# Patient Record
Sex: Male | Born: 1935 | Race: White | Hispanic: No | Smoking: Former smoker
Health system: Southern US, Community
[De-identification: ages and names within clinical notes are randomized; demographics above are authoritative.]

## PROBLEM LIST (undated history)

## (undated) DIAGNOSIS — N3289 Other specified disorders of bladder: Secondary | ICD-10-CM

## (undated) DIAGNOSIS — B351 Tinea unguium: Secondary | ICD-10-CM

## (undated) DIAGNOSIS — M109 Gout, unspecified: Secondary | ICD-10-CM

## (undated) DIAGNOSIS — N400 Enlarged prostate without lower urinary tract symptoms: Secondary | ICD-10-CM

## (undated) DIAGNOSIS — I471 Supraventricular tachycardia, unspecified: Secondary | ICD-10-CM

## (undated) DIAGNOSIS — K5730 Diverticulosis of large intestine without perforation or abscess without bleeding: Secondary | ICD-10-CM

## (undated) DIAGNOSIS — Z8546 Personal history of malignant neoplasm of prostate: Secondary | ICD-10-CM

## (undated) DIAGNOSIS — Z87442 Personal history of urinary calculi: Secondary | ICD-10-CM

## (undated) DIAGNOSIS — C459 Mesothelioma, unspecified: Secondary | ICD-10-CM

## (undated) DIAGNOSIS — G4733 Obstructive sleep apnea (adult) (pediatric): Secondary | ICD-10-CM

## (undated) DIAGNOSIS — M1990 Unspecified osteoarthritis, unspecified site: Secondary | ICD-10-CM

## (undated) DIAGNOSIS — G629 Polyneuropathy, unspecified: Secondary | ICD-10-CM

## (undated) DIAGNOSIS — G3184 Mild cognitive impairment, so stated: Secondary | ICD-10-CM

## (undated) HISTORY — DX: Unspecified osteoarthritis, unspecified site: M19.90

## (undated) HISTORY — DX: Personal history of urinary calculi: Z87.442

## (undated) HISTORY — DX: Obstructive sleep apnea (adult) (pediatric): G47.33

## (undated) HISTORY — PX: OTHER SURGICAL HISTORY: SHX169

## (undated) HISTORY — DX: Diverticulosis of large intestine without perforation or abscess without bleeding: K57.30

## (undated) HISTORY — DX: Mesothelioma, unspecified: C45.9

## (undated) HISTORY — DX: Mild cognitive impairment of uncertain or unknown etiology: G31.84

## (undated) HISTORY — DX: Tinea unguium: B35.1

## (undated) HISTORY — DX: Gout, unspecified: M10.9

## (undated) HISTORY — PX: CATARACT EXTRACTION: SUR2

## (undated) HISTORY — DX: Supraventricular tachycardia, unspecified: I47.10

## (undated) HISTORY — DX: Other specified disorders of bladder: N32.89

## (undated) HISTORY — DX: Supraventricular tachycardia: I47.1

## (undated) HISTORY — DX: Polyneuropathy, unspecified: G62.9

## (undated) HISTORY — PX: TOTAL KNEE ARTHROPLASTY: SHX125

## (undated) HISTORY — DX: Benign prostatic hyperplasia without lower urinary tract symptoms: N40.0

## (undated) HISTORY — DX: Personal history of malignant neoplasm of prostate: Z85.46

## (undated) HISTORY — PX: TONSILLECTOMY: SUR1361

---

## 1988-05-02 HISTORY — PX: OTHER SURGICAL HISTORY: SHX169

## 1988-05-02 HISTORY — PX: TRANSURETHRAL RESECTION OF PROSTATE: SHX73

## 2007-05-03 DIAGNOSIS — G3184 Mild cognitive impairment, so stated: Secondary | ICD-10-CM | POA: Insufficient documentation

## 2009-01-21 ENCOUNTER — Ambulatory Visit: Payer: Self-pay | Admitting: Internal Medicine

## 2009-01-21 DIAGNOSIS — R32 Unspecified urinary incontinence: Secondary | ICD-10-CM

## 2009-01-21 DIAGNOSIS — G589 Mononeuropathy, unspecified: Secondary | ICD-10-CM | POA: Insufficient documentation

## 2009-01-21 DIAGNOSIS — Z87442 Personal history of urinary calculi: Secondary | ICD-10-CM | POA: Insufficient documentation

## 2009-01-21 DIAGNOSIS — N400 Enlarged prostate without lower urinary tract symptoms: Secondary | ICD-10-CM | POA: Insufficient documentation

## 2009-01-21 DIAGNOSIS — M1990 Unspecified osteoarthritis, unspecified site: Secondary | ICD-10-CM | POA: Insufficient documentation

## 2009-01-21 DIAGNOSIS — E538 Deficiency of other specified B group vitamins: Secondary | ICD-10-CM | POA: Insufficient documentation

## 2009-01-21 DIAGNOSIS — K5730 Diverticulosis of large intestine without perforation or abscess without bleeding: Secondary | ICD-10-CM | POA: Insufficient documentation

## 2009-01-21 DIAGNOSIS — C61 Malignant neoplasm of prostate: Secondary | ICD-10-CM

## 2009-01-21 DIAGNOSIS — I498 Other specified cardiac arrhythmias: Secondary | ICD-10-CM | POA: Insufficient documentation

## 2009-01-21 DIAGNOSIS — E119 Type 2 diabetes mellitus without complications: Secondary | ICD-10-CM

## 2009-01-21 DIAGNOSIS — G4733 Obstructive sleep apnea (adult) (pediatric): Secondary | ICD-10-CM | POA: Insufficient documentation

## 2009-01-23 ENCOUNTER — Encounter: Payer: Self-pay | Admitting: Internal Medicine

## 2009-01-23 LAB — CONVERTED CEMR LAB
Basophils Absolute: 0.1 10*3/uL (ref 0.0–0.1)
Eosinophils Absolute: 0.2 10*3/uL (ref 0.0–0.7)
GFR calc non Af Amer: 85.45 mL/min (ref >60)
HCT: 38.3 % — ABNORMAL LOW (ref 39.0–52.0)
Hgb A1c MFr Bld: 5 % (ref 4.6–6.5)
Lymphs Abs: 1.2 10*3/uL (ref 0.7–4.0)
MCV: 93.6 fL (ref 78.0–100.0)
Monocytes Absolute: 0.5 10*3/uL (ref 0.1–1.0)
Monocytes Relative: 6.8 % (ref 3.0–12.0)
RDW: 14.3 % (ref 11.5–14.6)

## 2009-01-26 ENCOUNTER — Telehealth: Payer: Self-pay | Admitting: Internal Medicine

## 2009-01-26 ENCOUNTER — Encounter: Payer: Self-pay | Admitting: Internal Medicine

## 2009-02-06 ENCOUNTER — Ambulatory Visit: Payer: Self-pay | Admitting: Internal Medicine

## 2009-02-06 DIAGNOSIS — E039 Hypothyroidism, unspecified: Secondary | ICD-10-CM | POA: Insufficient documentation

## 2009-02-09 LAB — CONVERTED CEMR LAB: TSH: 0.59 microintl units/mL (ref 0.35–5.50)

## 2009-03-30 ENCOUNTER — Encounter: Payer: Self-pay | Admitting: Internal Medicine

## 2009-03-30 ENCOUNTER — Telehealth: Payer: Self-pay | Admitting: Internal Medicine

## 2009-05-14 ENCOUNTER — Telehealth: Payer: Self-pay | Admitting: Internal Medicine

## 2009-05-22 ENCOUNTER — Ambulatory Visit: Payer: Self-pay | Admitting: Internal Medicine

## 2009-05-22 DIAGNOSIS — L570 Actinic keratosis: Secondary | ICD-10-CM | POA: Insufficient documentation

## 2009-05-25 ENCOUNTER — Telehealth: Payer: Self-pay | Admitting: Internal Medicine

## 2009-08-24 ENCOUNTER — Ambulatory Visit: Payer: Self-pay | Admitting: Internal Medicine

## 2009-08-26 LAB — CONVERTED CEMR LAB
Albumin: 4 g/dL (ref 3.5–5.2)
Basophils Absolute: 0 10*3/uL (ref 0.0–0.1)
Calcium: 9.5 mg/dL (ref 8.4–10.5)
Creatinine, Ser: 0.9 mg/dL (ref 0.4–1.5)
Eosinophils Absolute: 0.2 10*3/uL (ref 0.0–0.7)
Glucose, Bld: 87 mg/dL (ref 70–99)
HCT: 36.6 % — ABNORMAL LOW (ref 39.0–52.0)
Lymphocytes Relative: 19.2 % (ref 12.0–46.0)
Lymphs Abs: 1.4 10*3/uL (ref 0.7–4.0)
MCHC: 34.8 g/dL (ref 30.0–36.0)
Monocytes Absolute: 0.5 10*3/uL (ref 0.1–1.0)
Neutro Abs: 5.2 10*3/uL (ref 1.4–7.7)
Neutrophils Relative %: 70.7 % (ref 43.0–77.0)
Phosphorus: 4.1 mg/dL (ref 2.3–4.6)
Platelets: 127 10*3/uL — ABNORMAL LOW (ref 150.0–400.0)
RBC: 3.82 M/uL — ABNORMAL LOW (ref 4.22–5.81)
RDW: 14.2 % (ref 11.5–14.6)
Sodium: 143 meq/L (ref 135–145)

## 2009-10-26 ENCOUNTER — Ambulatory Visit: Payer: Self-pay | Admitting: Internal Medicine

## 2009-10-26 ENCOUNTER — Telehealth: Payer: Self-pay | Admitting: Internal Medicine

## 2009-10-27 ENCOUNTER — Encounter: Payer: Self-pay | Admitting: Internal Medicine

## 2009-10-30 ENCOUNTER — Encounter: Payer: Self-pay | Admitting: Internal Medicine

## 2010-02-15 ENCOUNTER — Telehealth: Payer: Self-pay | Admitting: Internal Medicine

## 2010-02-15 ENCOUNTER — Ambulatory Visit: Payer: Self-pay | Admitting: Internal Medicine

## 2010-02-15 LAB — HM DIABETES FOOT EXAM

## 2010-02-17 LAB — CONVERTED CEMR LAB
AST: 23 units/L (ref 0–37)
Albumin: 4 g/dL (ref 3.5–5.2)
Basophils Relative: 0.7 % (ref 0.0–3.0)
CO2: 31 meq/L (ref 19–32)
Calcium: 9.7 mg/dL (ref 8.4–10.5)
Eosinophils Absolute: 0.2 10*3/uL (ref 0.0–0.7)
Free T4: 0.88 ng/dL (ref 0.60–1.60)
Glucose, Bld: 98 mg/dL (ref 70–99)
HCT: 39.2 % (ref 39.0–52.0)
Hemoglobin: 13.5 g/dL (ref 13.0–17.0)
Lymphs Abs: 1.4 10*3/uL (ref 0.7–4.0)
Phosphorus: 4.1 mg/dL (ref 2.3–4.6)
Sodium: 143 meq/L (ref 135–145)
TSH: 0.26 microintl units/mL — ABNORMAL LOW (ref 0.35–5.50)
Total Bilirubin: 1 mg/dL (ref 0.3–1.2)
Total Protein: 6.8 g/dL (ref 6.0–8.3)

## 2010-02-23 ENCOUNTER — Telehealth: Payer: Self-pay | Admitting: Internal Medicine

## 2010-02-25 ENCOUNTER — Encounter: Payer: Self-pay | Admitting: Internal Medicine

## 2010-03-01 ENCOUNTER — Encounter: Payer: Self-pay | Admitting: Internal Medicine

## 2010-03-04 ENCOUNTER — Telehealth: Payer: Self-pay | Admitting: Internal Medicine

## 2010-03-17 ENCOUNTER — Telehealth: Payer: Self-pay | Admitting: Internal Medicine

## 2010-03-18 ENCOUNTER — Encounter: Payer: Self-pay | Admitting: Internal Medicine

## 2010-05-18 ENCOUNTER — Ambulatory Visit: Admit: 2010-05-18 | Payer: Self-pay | Admitting: Internal Medicine

## 2010-06-01 NOTE — Progress Notes (Signed)
Summary: prior auth needed for aricept  Phone Note From Pharmacy   Caller: [facility] Summary of Call: Prior Berkley Harvey is needed for aricept, form is on your desk. Initial call taken by: Lowella Petties CMA, AAMA,  March 17, 2010 10:22 AM  Follow-up for Phone Call        form done Follow-up by: Cindee Salt MD,  March 17, 2010 2:13 PM  Additional Follow-up for Phone Call Additional follow up Details #1::        Form faxed.              Lowella Petties CMA, AAMA  March 17, 2010 3:02 PM  Approval letter placed on doctor's desk for signature and scanning. Additional Follow-up by: Lowella Petties CMA, AAMA,  March 19, 2010 12:09 PM

## 2010-06-01 NOTE — Progress Notes (Signed)
Summary: diverticulitis  Phone Note Call from Patient Call back at Home Phone [phone]   Caller: Patient Call For: Cindee Salt MD Summary of Call: Patient called saying that he had a flare up of diverticulitis patient also states its not serious but, he is still very tender when pressing on the area. Patient says that he dosent want to caome in just wonders if you can call medication in for the diverticulitis. Initial call taken by: Benny Lennert CMA Duncan Dull),  May 25, 2009 11:19 AM  Follow-up for Phone Call        okay to send Rx for cipro 500mg  two times a day  #14 x 0 see if fever, sig pain or not able to eat Follow-up by: Cindee Salt MD,  May 25, 2009 11:56 AM  Additional Follow-up for Phone Call Additional follow up Details #1::        Rx Called In, Spoke with patient and advised results.  Additional Follow-up by: Mervin Hack CMA Duncan Dull),  May 25, 2009 12:12 PM    New/Updated Medications: CIPROFLOXACIN HCL 500 MG TABS (CIPROFLOXACIN HCL) take 1 by mouth two times a day Prescriptions: CIPROFLOXACIN HCL 500 MG TABS (CIPROFLOXACIN HCL) take 1 by mouth two times a day  #14 x 0   Entered by:   Mervin Hack CMA (AAMA)   Authorized by:   Cindee Salt MD   Signed by:   Mervin Hack CMA (AAMA) on 05/25/2009   Method used:   Electronically to        [facility]. [address] [phone]* (retail)       [address] [place], [place]  [id number]       Ph: [phone]       Fax: [phone]   RxID:   [id number]

## 2010-06-01 NOTE — Progress Notes (Signed)
Summary: Order for CPAP Machine  Order for CPAP Machine   Imported By: Lanelle Bal 02/20/2010 11:14:59  _____________________________________________________________________  External Attachment:    Type:   Image     Comment:   External Document

## 2010-06-01 NOTE — Medication Information (Signed)
Summary: Aricept Approved  Aricept Approved   Imported By: Maryln Gottron 03/26/2010 15:28:49  _____________________________________________________________________  External Attachment:    Type:   Image     Comment:   External Document

## 2010-06-01 NOTE — Assessment & Plan Note (Signed)
Summary: 6 m f/u dlo   Vital Signs:  Patient profile:   77 year old male Weight:      220 pounds Temp:     98.3 degrees F oral Pulse rate:   72 / minute Pulse rhythm:   regular BP sitting:   120 / 70  (left arm) Cuff size:   large  Vitals Entered By: Mervin Hack CMA Duncan Dull) (February 15, 2010 10:25 AM) CC: 6 month follow-up   History of Present Illness: Doing fine Left arm is almost completely healed SOme trouble with full internal rotation but no sig pain  Still gets intermittent back pain has restarted seeing chiropractor---this does help him out some Gets momentary sense of weakness in right leg---not falls though  Occ cognitive problems Trouble with word finding at times One episode of the "disconnect" but very brief Still competitive with bridge  Bladder has been okay still with slow stream but empites okay Nocturia x 2  hasn't been checking sugars A1c has been fine  No cehst pain  No palpitiatons  Allergies: 1)  ! Penicillin V Potassium (Penicillin V Potassium) 2)  ! Keflex (Cephalexin)  Past History:  Past medical, surgical, family and social histories (including risk factors) reviewed for relevance to current acute and chronic problems.  Past Medical History: Reviewed history from 01/21/2009 and no changes required. Mild cognitive impairment Neuropathy Prostate cancer, hx of----lupron/casodex x 6 months then RT Benign prostatic hypertrophy Diabetes mellitus, type II--2008  Diet controlled Obstructive slep apnea---CPAP 13 Osteoarthritis SVT Nephrolithiasis, hx of Diverticulosis, colon Urinary incontinence/bladder spasm--med/briefs  Past Surgical History: Reviewed history from 01/23/2009 and no changes required. Tonsillectomy Transurethral resection of prostate--1990 SVT ablation--1990 Left TKR---   ~2007 Eyelid reduction 2002/2006 Cataracts OU then laser Rx to both eyes  Family History: Reviewed history from 01/21/2009 and no changes  required. Mom died of breast cancer @85  Dad died of nephritis @59  1 sister --breast cancer survivor Kendell Bane) 1 brother--Crohn's disease No CAD, HTN Son with DM No colon cancer  Social History: Reviewed history from 01/21/2009 and no changes required. 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 Has living will. Requests DNR very strongly. Would not accept tube feedings  Review of Systems       sleeps well in general Appetite is good---  weight down 9# but he isn't sure why Has been trying to be careful with quantity that he eats  Physical Exam  General:  alert and normal appearance.   Neck:  supple, no masses, no thyromegaly, no carotid bruits, and no cervical lymphadenopathy.   Lungs:  normal respiratory effort, no intercostal retractions, no accessory muscle use, and normal breath sounds.   Heart:  normal rate, regular rhythm, and no gallop.   SOft systolic murmur at apex Pulses:  faint in feet Extremities:  no sig edema Skin:  no suspicious lesions and no ulcerations.   Psych:  normally interactive, good eye contact, not anxious appearing, and not depressed appearing.    Diabetes Management Exam:    Foot Exam (with socks and/or shoes not present):       Sensory-Pinprick/Light touch:          Left medial foot (L-4): diminished          Left dorsal foot (L-5): diminished  Left lateral foot (S-1): diminished          Right medial foot (L-4): diminished          Right dorsal foot (L-5): diminished          Right lateral foot (S-1): diminished       Inspection:          Left foot: normal          Right foot: normal       Nails:          Left foot: fungal infection          Right foot: fungal infection    Eye Exam:       Eye Exam done elsewhere           Date: 11/30/2009          Results: no retinopathy          Done by: Dr Dewaine Conger   Impression & Recommendations:  Problem # 1:  DIABETES MELLITUS, TYPE II (ICD-250.00) Assessment Unchanged  fine with just diet Rx will recheck --not clear he really has this diagnosis any more  His updated medication list for this problem includes:    Aspirin 325 Mg Tabs (Aspirin) .Marland Kitchen... Take 1/2 by mouth once daily  Labs Reviewed: Creat: 0.9 (08/24/2009)     Last Eye Exam: no retinopathy (11/30/2009) Reviewed HgBA1c results: 5.0 (08/24/2009)  5.0 (01/21/2009)  Orders: TLB-A1C / Hgb A1C (Glycohemoglobin) (83036-A1C)  Problem # 2:  SUPRAVENTRICULAR TACHYCARDIA (ICD-427.89) Assessment: Comment Only  no symtpoms  His updated medication list for this problem includes:    Propranolol Hcl Cr 60 Mg Xr24h-cap (Propranolol hcl) .Marland Kitchen... Take 1 by mouth once daily    Aspirin 325 Mg Tabs (Aspirin) .Marland Kitchen... Take 1/2 by mouth once daily  Orders: TLB-Renal Function Panel (80069-RENAL) TLB-CBC Platelet - w/Differential (85025-CBCD) TLB-Hepatic/Liver Function Pnl (80076-HEPATIC) Venipuncture (54098)  Problem # 3:  UNSPECIFIED HYPOTHYROIDISM (ICD-244.9) Assessment: Unchanged  euthyroid  will recheck labs  Labs Reviewed: TSH: 0.33 (08/24/2009)    HgBA1c: 5.0 (08/24/2009)  Orders: TLB-TSH (Thyroid Stimulating Hormone) (84443-TSH) TLB-T4 (Thyrox), Free [phone])  Problem # 4:  URINARY INCONTINENCE (ICD-788.30) Assessment: Unchanged okay with meds  Problem # 5:  VITAMIN B12 DEFICIENCY (ICD-266.2) Assessment: Unchanged okay with the supplements  Problem # 6:  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 Take 1 by mouth once daily 2)  Propranolol Hcl Cr 60 Mg Xr24h-cap (Propranolol hcl) .... Take 1 by mouth once daily 3)  Oxybutynin Chloride 5 Mg Tabs (Oxybutynin  chloride) .... Take 1 by mouth once daily 4)  Cyanocobalamin 1000 Mcg/ml Soln (Cyanocobalamin) .... Inject 1cc intramuscularly every 2 weeks 5)  Aspirin 325 Mg Tabs (Aspirin) .... Take 1/2 by mouth once daily 6)  Daily Vitamins Tabs (Multiple vitamin) .... Take 1 by mouth once daily 7)  Fish Oil 1000 Mg Caps (Omega-3 fatty acids) .... Take 1 by mouth once daily 8)  Vitamin D3 1000 Unit Tabs (Cholecalciferol) .... Take 1 by mouth once daily 9)  Folic Acid 800 Mcg Tabs (Folic acid) .... Take 1 by mouth once daily 10)  B Complex 50 Tabs (B complex vitamins) .... Take 1 by mouth once daily 11)  Metamucil 30.9 % Powd (Psyllium) .... Take 2 teaspoons by mouth once daily  Other Orders: Influenza Vaccine MCR (56213)  Patient Instructions: 1)  Please schedule a  follow-up appointment in 6 months .    Orders Added: 1)  Influenza Vaccine MCR [00025] 2)  Est. Patient Level IV [59563] 3)  TLB-Renal Function Panel [80069-RENAL] 4)  TLB-CBC Platelet - w/Differential [85025-CBCD] 5)  TLB-Hepatic/Liver Function Pnl [80076-HEPATIC] 6)  Venipuncture [87564] 7)  TLB-TSH (Thyroid Stimulating Hormone) [84443-TSH] 8)  TLB-T4 (Thyrox), Free [33295-JO8C] 9)  TLB-A1C / Hgb A1C (Glycohemoglobin) [83036-A1C]   Immunizations Administered:  Influenza Vaccine # 1:    Vaccine Type: Fluvax MCR    Site: left deltoid    Mfr: GlaxoSmithKline    Dose: 0.5 ml    Route: IM    Given by: Mervin Hack CMA (AAMA)    Exp. Date: 10/30/2010    Lot #: [id number]    VIS given: 11/24/09 version given February 15, 2010.  Flu Vaccine Consent Questions:    Do you have a history of severe allergic reactions to this vaccine? no    Any prior history of allergic reactions to egg and/or gelatin? no    Do you have a sensitivity to the preservative Thimersol? no    Do you have a past history of Guillan-Barre Syndrome? no    Do you currently have an acute febrile illness? no    Have you ever had a severe reaction to latex? no     Vaccine information given and explained to patient? yes   Immunizations Administered:  Influenza Vaccine # 1:    Vaccine Type: Fluvax MCR    Site: left deltoid    Mfr: GlaxoSmithKline    Dose: 0.5 ml    Route: IM    Given by: Mervin Hack CMA (AAMA)    Exp. Date: 10/30/2010    Lot #: [id number]    VIS given: 11/24/09 version given February 15, 2010.  Current Allergies (reviewed today): ! PENICILLIN V POTASSIUM (PENICILLIN V POTASSIUM) ! KEFLEX (CEPHALEXIN)

## 2010-06-01 NOTE — Progress Notes (Signed)
Summary: ? OV  Phone Note Call from Patient Call back at Home Phone [phone] Call back at Out from 12 to 1:30, please leave message   Caller: Patient Call For: Cindee Salt MD Summary of Call: Patient says he just returned from vacation where he fell last Monday.  His left arm is sore and he has very limited motion in that arm.  He wonders if perhaps he needs an x-ray and possibly some PT to loosen up the arm.  He was in hopes of coming in today.  Please advise. Initial call taken by: Delilah Shan CMA Duncan Dull),  October 26, 2009 10:28 AM  Follow-up for Phone Call        okay to add at [facility] --it looks like I still have an opening there Follow-up by: Cindee Salt MD,  October 26, 2009 11:12 AM  Additional Follow-up for Phone Call Additional follow up Details #1::        appt made @ 2;00 Additional Follow-up by: Mervin Hack CMA Duncan Dull),  October 26, 2009 11:33 AM

## 2010-06-01 NOTE — Assessment & Plan Note (Signed)
Summary: 2:00 ARM PAIN/DS   Vital Signs:  Patient profile:   77 year old male Weight:      229 pounds BMI:     35.73 Temp:     98.4 degrees F oral Pulse rate:   60 / minute Pulse rhythm:   regular BP sitting:   122 / 60  (left arm) Cuff size:   large  Vitals Entered By: Mervin Hack CMA Duncan Dull) (October 26, 2009 2:11 PM) CC: arm pain x1 week   History of Present Illness: Was on vacation in [place] fishing for [employer] to plug in CPAP and missed bed as he was sitting down Fell onto left shoulder and arm This happened 1 week ago Sig pain since then Hard to move his arm but slow improvement pain is still fairly significant---limited use  Allergies: 1)  ! Penicillin V Potassium (Penicillin V Potassium) 2)  ! Keflex (Cephalexin)  Past History:  Past medical, surgical, family and social histories (including risk factors) reviewed for relevance to current acute and chronic problems.  Past Medical History: Reviewed history from 01/21/2009 and no changes required. Mild cognitive impairment Neuropathy Prostate cancer, hx of----lupron/casodex x 6 months then RT Benign prostatic hypertrophy Diabetes mellitus, type II--2008  Diet controlled Obstructive slep apnea---CPAP 13 Osteoarthritis SVT Nephrolithiasis, hx of Diverticulosis, colon Urinary incontinence/bladder spasm--med/briefs  Past Surgical History: Reviewed history from 01/23/2009 and no changes required. Tonsillectomy Transurethral resection of prostate--1990 SVT ablation--1990 Left TKR---   ~2007 Eyelid reduction 2002/2006 Cataracts OU then laser Rx to both eyes  Family History: Reviewed history from 01/21/2009 and no changes required. Mom died of breast cancer @85  Dad died of nephritis @59  1 sister --breast cancer survivor Kendell Bane) 1 brother--Crohn's disease No CAD, HTN Son with DM No colon cancer  Social History: Reviewed history from 01/21/2009 and no changes required. 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 Has living will. Requests DNR very strongly. Would not accept tube feedings  Review of Systems       no hand weakness no hand swelling  Physical Exam  General:  alert.  NAD Msk:  sig bruising along mid left arm no clear bony tenderness sig limitation of abduction---he can raise arm above head with other arm and I can passively abduct to 90 degrees  Neurologic:  normal strength in hands   Impression & Recommendations:  Problem # 1:  FRACTURE, HUMERUS, PROXIMAL (ICD-812.00) Assessment New  not clear cut but he does point to this spot with pain lack of abduction could indicate concommitant rotator cuff tear  will put in immobilizer and send to ortho  Orders: Slings- All Types (Z6109) Orthopedic Surgeon Referral (Ortho Surgeon)  Complete Medication List: 1)  Aricept 10 Mg Tabs (Donepezil hcl) .... Take 1 by mouth once daily 2)  Propranolol Hcl Cr 60 Mg Xr24h-cap (Propranolol hcl) .... Take 1 by mouth once daily 3)  Oxybutynin Chloride 5 Mg Tabs (Oxybutynin chloride) .... Take 1 by mouth once daily 4)  Cyanocobalamin 1000 Mcg/ml Soln (Cyanocobalamin) .... Inject 1cc intramuscularly every 2 weeks 5)  Aspirin 325 Mg Tabs (Aspirin) .... Take 1/2 by mouth once daily 6)  Daily Vitamins Tabs (Multiple vitamin) .... Take 1 by mouth once daily 7)  Fish Oil 1000 Mg Caps (Omega-3 fatty acids) .... Take 1 by mouth once daily 8)  Calcium 500 Mg Tabs (Calcium carbonate) .... Take 1 by mouth once daily 9)  Vitamin D3 1000 Unit Tabs (Cholecalciferol) .... Take 1 by mouth once daily 10)  Vitamin E 400 Unit Caps (Vitamin e) .... Take 1 by mouth once daily 11)  Folic Acid 800 Mcg Tabs (Folic acid) .... Take 1 by mouth once daily 12)  Selenium 200 Mcg Tabs  (Selenium) .... Take 1 by mouth once daily 13)  Magnesium 250 Mg Tabs (Magnesium) .... Take 1 by mouth once daily 14)  Zinc 50 Mg Tabs (Zinc) .... Take 1 by mouth once daily 15)  B Complex 50 Tabs (B complex vitamins) .... Take 1 by mouth once daily 16)  Metamucil 30.9 % Powd (Psyllium) .... Take 2 teaspoons by mouth once daily  Other Orders: T-Humerus Left 2 Views (73060TC)  Patient Instructions: 1)  Please keep your regular appt 2)  Referral Appointment Information 3)  Day/Date: 4)  Time: 5)  Place/MD: 6)  Address: 7)  Phone/Fax: 8)  Patient given appointment information. Information/Orders faxed/mailed.  Current Allergies (reviewed today): ! PENICILLIN V POTASSIUM (PENICILLIN V POTASSIUM) ! KEFLEX (CEPHALEXIN)

## 2010-06-01 NOTE — Progress Notes (Signed)
Summary: RX Oxubutynin  Phone Note Call from Patient Call back at [phone]   Caller: Patient Call For: Cindee Salt MD Summary of Call: Patient needs a refill on his Oxybutynin 5mg . sent to [facility] for a 90 day supply with 3 refills Initial call taken by: Sydell Axon LPN,  May 14, 2009 11:51 AM  Follow-up for Phone Call        Rx sent electronically  Please let him know Follow-up by: Cindee Salt MD,  May 14, 2009 1:32 PM  Additional Follow-up for Phone Call Additional follow up Details #1::        left message on machine that rx was sent to [facility] Additional Follow-up by: Mervin Hack CMA Duncan Dull),  May 14, 2009 2:26 PM    Prescriptions: OXYBUTYNIN CHLORIDE 5 MG TABS (OXYBUTYNIN CHLORIDE) take 1 by mouth once daily  #90 x 3   Entered and Authorized by:   Cindee Salt MD   Signed by:   Cindee Salt MD on 05/14/2009   Method used:   Electronically to        [facility]* (mail-order)             ,          Ph: [phone]       Fax: [phone]   RxID:   [id number]

## 2010-06-01 NOTE — Progress Notes (Signed)
Summary: cpap machine   Phone Note Other Incoming   Caller: [facility]  Summary of Call: Melissa from [facility] says that the rx they received for the cpap machine did not include the settings. They are asking for a new order w/ the settings to be faxed to [phone]. Initial call taken by: Melody Comas,  February 23, 2010 10:50 AM  Follow-up for Phone Call        please call him to confirm his settings Cindee Salt MD  February 23, 2010 1:55 PM   left message on machine at home for patient to return my call.  DeShannon Smith CMA Duncan Dull)  February 23, 2010 4:33 PM   left message on machine at home for patient to return my call.  DeShannon Smith CMA Duncan Dull)  February 24, 2010 3:16 PM   Additional Follow-up for Phone Call Additional follow up Details #1::        Pt called back with setting, says it is 13.      Lowella Petties CMA, AAMA  February 25, 2010 9:42 AM   New Rx written Cindee Salt MD  February 25, 2010 12:48 PM   rx faxed and scanned into chart. Additional Follow-up by: Mervin Hack CMA Duncan Dull),  February 25, 2010 2:19 PM

## 2010-06-01 NOTE — Assessment & Plan Note (Signed)
Summary: ROA 4 MTHS CYD   Vital Signs:  Patient profile:   77 year old male Weight:      219 pounds Temp:     98.2 degrees F oral Pulse rate:   64 / minute Pulse rhythm:   regular BP sitting:   110 / 60  (left arm) Cuff size:   large  Vitals Entered By: Mervin Hack CMA Duncan Dull) (May 22, 2009 11:13 AM) CC: 46month follow-up   History of Present Illness: has really settled in at [facility] very well Plays bridge in community  Has a patch of crusting on right arm scratches and it bleeds at times  Knot noted on right foot Not sure how long No pain does follow with podiatrist  Needs new CPAP supplies sleeps well in general  No sig cognitive changes Executive function fine (wins at bridge, handles finances) still on aricept No apraxia  Urination has been fine flow is slow but empties okay still wears briefs  Hasn't been checking sugar A1c was normal    Allergies: 1)  ! Penicillin V Potassium (Penicillin V Potassium) 2)  ! Keflex (Cephalexin)  Past History:  Past medical, surgical, family and social histories (including risk factors) reviewed for relevance to current acute and chronic problems.  Past Medical History: Reviewed history from 01/21/2009 and no changes required. Mild cognitive impairment Neuropathy Prostate cancer, hx of----lupron/casodex x 6 months then RT Benign prostatic hypertrophy Diabetes mellitus, type II--2008  Diet controlled Obstructive slep apnea---CPAP 13 Osteoarthritis SVT Nephrolithiasis, hx of Diverticulosis, colon Urinary incontinence/bladder spasm--med/briefs  Past Surgical History: Reviewed history from 01/23/2009 and no changes required. Tonsillectomy Transurethral resection of prostate--1990 SVT ablation--1990 Left TKR---   ~2007 Eyelid reduction 2002/2006 Cataracts OU then laser Rx to both eyes  Family History: Reviewed history from 01/21/2009 and no changes required. Mom died of breast cancer @85  Dad died  of nephritis @59  1 sister --breast cancer survivor Kendell Bane) 1 brother--Crohn's disease No CAD, HTN Son with DM No colon cancer  Social History: Reviewed history from 01/21/2009 and no changes required. 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 Has living will. Requests DNR very strongly. Would not accept tube feedings  Physical Exam  General:  alert and normal appearance.   Neck:  supple, no masses, no thyromegaly, and no cervical lymphadenopathy.   Lungs:  normal respiratory effort and normal breath sounds.   Heart:  normal rate, regular rhythm, no murmur, and no gallop.   Extremities:  no edema Neurologic:  alert & oriented X3.  No focal weakness Skin:  2 actinics on right arm small cyst just next to tendon to second right toe Psych:  normally interactive, good eye contact, not anxious appearing, and not depressed appearing.    Diabetes Management Exam:    Foot Exam (with socks and/or shoes not present):       Sensory-Pinprick/Light touch:          Left medial foot (L-4): diminished          Left dorsal foot (L-5): diminished          Left lateral foot (S-1): diminished          Right medial foot (L-4): diminished          Right dorsal foot (L-5): diminished  Right lateral foot (S-1): diminished       Inspection:          Left foot: abnormal             Comments: redness at toes          Right foot: abnormal             Comments: redness at toes       Nails:          Left foot: fungal infection          Right foot: fungal infection   Impression & Recommendations:  Problem # 1:  MILD COGNITIVE IMPAIRMENT SO STATED (ICD-331.83) Assessment Unchanged stable no problems with memory, executive funciton or praxis will continue aricept  Problem  # 2:  SUPRAVENTRICULAR TACHYCARDIA (ICD-427.89) Assessment: Unchanged fine on the med  His updated medication list for this problem includes:    Propranolol Hcl Cr 60 Mg Xr24h-cap (Propranolol hcl) .Marland Kitchen... Take 1 by mouth once daily    Aspirin 325 Mg Tabs (Aspirin) .Marland Kitchen... Take 1/2 by mouth once daily  Problem # 3:  UNSPECIFIED HYPOTHYROIDISM (ICD-244.9) Assessment: Unchanged labs okay  no change  Labs Reviewed: TSH: 0.59 (02/06/2009)    HgBA1c: 5.0 (01/21/2009)  Problem # 4:  BENIGN PROSTATIC HYPERTROPHY (ICD-600.00) Assessment: Unchanged voids okay chronic mild incontinence  Problem # 5:  ACTINIC KERATOSIS (ICD-702.0) Assessment: New  each treated 40 seconds x 2  Orders: Cryotherapy/Destruction benign or premalignant lesion (1st lesion)  (17000) Cryotherapy/Destruction benign or premalignant lesion (2nd-14th lesions) (17003)  Complete Medication List: 1)  Aricept 10 Mg Tabs (Donepezil hcl) .... Take 1 by mouth once daily 2)  Propranolol Hcl Cr 60 Mg Xr24h-cap (Propranolol hcl) .... Take 1 by mouth once daily 3)  Oxybutynin Chloride 5 Mg Tabs (Oxybutynin chloride) .... Take 1 by mouth once daily 4)  Cyanocobalamin 1000 Mcg/ml Soln (Cyanocobalamin) .... Inject 1cc intramuscularly every 2 weeks 5)  Aspirin 325 Mg Tabs (Aspirin) .... Take 1/2 by mouth once daily 6)  Daily Vitamins Tabs (Multiple vitamin) .... Take 1 by mouth once daily 7)  Fish Oil 1000 Mg Caps (Omega-3 fatty acids) .... Take 1 by mouth once daily 8)  Calcium 500 Mg Tabs (Calcium carbonate) .... Take 1 by mouth once daily 9)  Vitamin D3 1000 Unit Tabs (Cholecalciferol) .... Take 1 by mouth once daily 10)  Vitamin E 400 Unit Caps (Vitamin e) .... Take 1 by mouth once daily 11)  Folic Acid 800 Mcg Tabs (Folic acid) .... Take 1 by mouth once daily 12)  Selenium 200 Mcg Tabs (Selenium) .... Take 1 by mouth once daily 13)  Magnesium 250 Mg Tabs (Magnesium) .... Take 1 by mouth once daily 14)  Zinc 50 Mg Tabs (Zinc)  .... Take 1 by mouth once daily 15)  B Complex 50 Tabs (B complex vitamins) .... Take 1 by mouth once daily 16)  Metamucil 30.9 % Powd (Psyllium) .... Take 2 teaspoons by mouth once daily  Patient Instructions: 1)  Please schedule a follow-up appointment in 4 months .   Current Allergies (reviewed today): ! PENICILLIN V POTASSIUM (PENICILLIN V POTASSIUM) ! KEFLEX (CEPHALEXIN)

## 2010-06-01 NOTE — Progress Notes (Signed)
Summary: needs new cpap machine   Phone Note Call from Patient Call back at Home Phone [phone]   Caller: Patient Call For: Cindee Salt MD Summary of Call: Patient's cpap mahcine is not working and can't be  repaired according to [facility]. Patient is asking for a rx for a new cpap machine to be faxed to [facility] stating that he requires treatment for sleep apnia. It needs to faxed to [phone] attention Melissa.  Initial call taken by: Melody Comas,  February 15, 2010 1:41 PM  Follow-up for Phone Call        Rx written Follow-up by: Cindee Salt MD,  February 15, 2010 2:06 PM  Additional Follow-up for Phone Call Additional follow up Details #1::        rx faxed and scanned Additional Follow-up by: Mervin Hack CMA Duncan Dull),  February 15, 2010 3:39 PM

## 2010-06-01 NOTE — Letter (Signed)
Summary: Records Dated 09-02-05 thru 06-16-08/Amarish Davd DO  Records Dated 09-02-05 thru 06-16-08/Amarish Davd DO   Imported By: Lanelle Bal 09/04/2009 09:45:10  _____________________________________________________________________  External Attachment:    Type:   Image     Comment:   External Document

## 2010-06-01 NOTE — Assessment & Plan Note (Signed)
Summary: 4 m f/u dlo   Vital Signs:  Patient profile:   77 year old male Weight:      220 pounds Temp:     98.7 degrees F oral Pulse rate:   60 / minute Pulse rhythm:   regular BP sitting:   110 / 62  (left arm) Cuff size:   large  Vitals Entered By: Mervin Hack CMA Duncan Dull) (August 24, 2009 4:23 PM) CC: 4 month follow-up   History of Present Illness: Memory has been stable still highly functional Still helps [facility] with their grant process, etc Did have one period of "disconnect" that lasted seconds----disturbed his flow during bridge but able to get it back Doesn't get lost in car Does his own shopping--mostly prepared foods  doesn't check sugars has had normal sugars  No heart troubles No palpitiations No chest pain Fairly sedentary--does walk up to 1 mile at times No change in exercise tolerance  Having some thumb pain--on right Has DIP nodule worse in past 3 weeks---has "clunking" when he flexes Hasn't used any meds since pain not bad  Podiatrist just trimmed nails  slight bleeding from left 1st  Allergies: 1)  ! Penicillin V Potassium (Penicillin V Potassium) 2)  ! Keflex (Cephalexin)  Past History:  Past medical, surgical, family and social histories (including risk factors) reviewed for relevance to current acute and chronic problems.  Past Medical History: Reviewed history from 01/21/2009 and no changes required. Mild cognitive impairment Neuropathy Prostate cancer, hx of----lupron/casodex x 6 months then RT Benign prostatic hypertrophy Diabetes mellitus, type II--2008  Diet controlled Obstructive slep apnea---CPAP 13 Osteoarthritis SVT Nephrolithiasis, hx of Diverticulosis, colon Urinary incontinence/bladder spasm--med/briefs  Past Surgical History: Reviewed history from 01/23/2009 and no changes required. Tonsillectomy Transurethral resection of prostate--1990 SVT ablation--1990 Left TKR---   ~2007 Eyelid reduction  2002/2006 Cataracts OU then laser Rx to both eyes  Family History: Reviewed history from 01/21/2009 and no changes required. Mom died of breast cancer @85  Dad died of nephritis @59  1 sister --breast cancer survivor Kendell Bane) 1 brother--Crohn's disease No CAD, HTN Son with DM No colon cancer  Social History: Reviewed history from 01/21/2009 and no changes required. 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 Has living will. Requests DNR very strongly. Would not accept tube feedings  Physical Exam  General:  alert and normal appearance.   Neck:  supple, no masses, no thyromegaly, no carotid bruits, and no cervical lymphadenopathy.   Lungs:  normal respiratory effort and normal breath sounds.   Heart:  regular rhythm, no murmur, no gallop, and bradycardia.   Msk:  Nodule on right 2nd PIP--slight clunk but no pain Pulses:  1+ in feet Extremities:  trace edema on left foot none on right Skin:  no suspicious lesions and no ulcerations.    Psych:  normally interactive, good eye contact, not anxious appearing, and not depressed appearing.    Diabetes Management Exam:    Foot Exam (with socks and/or shoes not present):       Sensory-Pinprick/Light touch:          Left medial foot (L-4): diminished          Left dorsal foot (L-5): diminished          Left lateral foot (S-1): diminished  Right medial foot (L-4): diminished          Right dorsal foot (L-5): diminished          Right lateral foot (S-1): diminished       Inspection:          Left foot: normal          Right foot: normal       Nails:          Left foot: fungal infection          Right foot: fungal infection    Eye Exam:       Eye Exam done elsewhere          Date: 12/01/2008           Results: no retinopathy          Done by: Dr Daryl Eastern   Impression & Recommendations:  Problem # 1:  MILD COGNITIVE IMPAIRMENT SO STATED (ICD-331.83) Assessment Unchanged stable cognitive status will continue the aricept  Problem # 2:  SUPRAVENTRICULAR TACHYCARDIA (ICD-427.89) Assessment: Unchanged no recurrences on propranolol also uses this for migraine prophylaxis so will continue despite the (asymptomatic) bradycardia  His updated medication list for this problem includes:    Propranolol Hcl Cr 60 Mg Xr24h-cap (Propranolol hcl) .Marland Kitchen... Take 1 by mouth once daily    Aspirin 325 Mg Tabs (Aspirin) .Marland Kitchen... Take 1/2 by mouth once daily  Problem # 3:  OSTEOARTHRITIS (ICD-715.90) Assessment: Unchanged mild problems only  His updated medication list for this problem includes:    Aspirin 325 Mg Tabs (Aspirin) .Marland Kitchen... Take 1/2 by mouth once daily  Problem # 4:  DIABETES MELLITUS, TYPE II (ICD-250.00) Assessment: Unchanged  fine without Rx will check labs  His updated medication list for this problem includes:    Aspirin 325 Mg Tabs (Aspirin) .Marland Kitchen... Take 1/2 by mouth once daily  Labs Reviewed: Creat: 0.9 (01/21/2009)     Last Eye Exam: no retinopathy (12/01/2008) Reviewed HgBA1c results: 5.0 (01/21/2009)  Orders: TLB-Renal Function Panel (80069-RENAL) TLB-CBC Platelet - w/Differential (85025-CBCD) Venipuncture (16109) TLB-A1C / Hgb A1C (Glycohemoglobin) (83036-A1C)  Problem # 5:  BENIGN PROSTATIC HYPERTROPHY (ICD-600.00) Assessment: Unchanged voids okay  Complete Medication List: 1)  Aricept 10 Mg Tabs (Donepezil hcl) .... Take 1 by mouth once daily 2)  Propranolol Hcl Cr 60 Mg Xr24h-cap (Propranolol hcl) .... Take 1 by mouth once daily 3)  Oxybutynin Chloride 5 Mg Tabs (Oxybutynin chloride) .... Take 1 by mouth once daily 4)  Cyanocobalamin 1000 Mcg/ml Soln (Cyanocobalamin) .... Inject 1cc intramuscularly every 2 weeks 5)  Aspirin 325 Mg Tabs (Aspirin) .... Take 1/2  by mouth once daily 6)  Daily Vitamins Tabs (Multiple vitamin) .... Take 1 by mouth once daily 7)  Fish Oil 1000 Mg Caps (Omega-3 fatty acids) .... Take 1 by mouth once daily 8)  Calcium 500 Mg Tabs (Calcium carbonate) .... Take 1 by mouth once daily 9)  Vitamin D3 1000 Unit Tabs (Cholecalciferol) .... Take 1 by mouth once daily 10)  Vitamin E 400 Unit Caps (Vitamin e) .... Take 1 by mouth once daily 11)  Folic Acid 800 Mcg Tabs (Folic acid) .... Take 1 by mouth once daily 12)  Selenium 200 Mcg Tabs (Selenium) .... Take 1 by mouth once daily 13)  Magnesium 250 Mg Tabs (Magnesium) .... Take 1 by mouth once daily 14)  Zinc 50 Mg Tabs (Zinc) .... Take 1 by mouth once daily 15)  B Complex 50 Tabs (B complex vitamins) .Marland KitchenMarland KitchenMarland Kitchen  Take 1 by mouth once daily 16)  Metamucil 30.9 % Powd (Psyllium) .... Take 2 teaspoons by mouth once daily  Other Orders: TLB-T4 (Thyrox), Free [phone]) TLB-TSH (Thyroid Stimulating Hormone) [phone])  Patient Instructions: 1)  Please schedule a follow-up appointment in 6 months .  Prescriptions: PROPRANOLOL HCL CR 60 MG XR24H-CAP (PROPRANOLOL HCL) take 1 by mouth once daily  #90 x 3   Entered by:   Mervin Hack CMA (AAMA)   Authorized by:   Cindee Salt MD   Signed by:   Mervin Hack CMA (AAMA) on 08/24/2009   Method used:   Electronically to        [facility] (mail-order)             ,          Ph: [phone]       Fax: [phone]   RxID:   [id number]   Current Allergies (reviewed today): ! PENICILLIN V POTASSIUM (PENICILLIN V POTASSIUM) ! KEFLEX (CEPHALEXIN)

## 2010-06-01 NOTE — Miscellaneous (Signed)
Summary: Do Not Resuscitate Orders  Do Not Resuscitate Orders   Imported By: Beau Fanny 12/10/2009 14:52:06  _____________________________________________________________________  External Attachment:    Type:   Image     Comment:   External Document

## 2010-06-01 NOTE — Consult Note (Signed)
Summary: [facility]  [facility]   Imported By: Lanelle Bal 11/11/2009 14:16:06  _____________________________________________________________________  External Attachment:    Type:   Image     Comment:   External Document  Appended Document: [facility] continuing sling for right proximal humeral fracture 2 week follow up

## 2010-06-01 NOTE — Medication Information (Signed)
Summary: Order for CPAP Machine & Supplies  Order for CPAP Machine & Supplies   Imported By: Maryln Gottron 03/03/2010 11:12:29  _____________________________________________________________________  External Attachment:    Type:   Image     Comment:   External Document

## 2010-06-01 NOTE — Progress Notes (Signed)
Summary: aricept  Phone Note Refill Request Message from:  Patient on March 04, 2010 5:02 PM  Refills Requested: Medication #1:  ARICEPT 10 MG TABS take 1 by mouth once daily Patient uses [facility].   Initial call taken by: Melody Comas,  March 04, 2010 5:02 PM    Prescriptions: ARICEPT 10 MG TABS (DONEPEZIL HCL) take 1 by mouth once daily  #90 x 3   Entered by:   Lowella Petties CMA, AAMA   Authorized by:   Cindee Salt MD   Signed by:   Lowella Petties CMA, AAMA on 03/05/2010   Method used:   Faxed to ...       [facility] (mail-[facility])             , [place]         Ph: [phone]       Fax: [phone]   RxID:   [id number]

## 2010-06-01 NOTE — Letter (Signed)
Summary: CMN for CPAP/[facility]  CMN for CPAP/[facility]   Imported By: Lanelle Bal 03/05/2010 15:25:39  _____________________________________________________________________  External Attachment:    Type:   Image     Comment:   External Document

## 2010-06-09 ENCOUNTER — Telehealth: Payer: Self-pay | Admitting: Internal Medicine

## 2010-06-17 NOTE — Progress Notes (Signed)
Summary: new script needed for propranolol  Phone Note Refill Request Message from:  Fax from Pharmacy  Refills Requested: Medication #1:  PROPRANOLOL HCL CR 60 MG XR24H-CAP take 1 by mouth once daily Faxed form from [facility] is on your desk.  They are asking for a new script.  Initial call taken by: Lowella Petties CMA, AAMA,  June 09, 2010 3:36 PM  Follow-up for Phone Call        Rx signed for 1 year Follow-up by: Cindee Salt MD,  June 10, 2010 1:04 PM  Additional Follow-up for Phone Call Additional follow up Details #1::        Rx faxed to pharmacy Additional Follow-up by: DeShannon Smith CMA (AAMA),  June 10, 2010 2:00 PM    Prescriptions: PROPRANOLOL HCL CR 60 MG XR24H-CAP (PROPRANOLOL HCL) take 1 by mouth once daily  #90 x 3   Entered by:   Mervin Hack CMA (AAMA)   Authorized by:   Cindee Salt MD   Signed by:   Mervin Hack CMA (AAMA) on 06/10/2010   Method used:   Handwritten   RxID:   [id number]

## 2010-06-30 ENCOUNTER — Encounter: Payer: Self-pay | Admitting: Internal Medicine

## 2010-06-30 DIAGNOSIS — N400 Enlarged prostate without lower urinary tract symptoms: Secondary | ICD-10-CM

## 2010-06-30 DIAGNOSIS — M1990 Unspecified osteoarthritis, unspecified site: Secondary | ICD-10-CM

## 2010-06-30 DIAGNOSIS — I471 Supraventricular tachycardia, unspecified: Secondary | ICD-10-CM

## 2010-06-30 DIAGNOSIS — R32 Unspecified urinary incontinence: Secondary | ICD-10-CM | POA: Insufficient documentation

## 2010-06-30 DIAGNOSIS — K5730 Diverticulosis of large intestine without perforation or abscess without bleeding: Secondary | ICD-10-CM

## 2010-06-30 DIAGNOSIS — N3289 Other specified disorders of bladder: Secondary | ICD-10-CM

## 2010-06-30 DIAGNOSIS — G4733 Obstructive sleep apnea (adult) (pediatric): Secondary | ICD-10-CM

## 2010-06-30 DIAGNOSIS — G629 Polyneuropathy, unspecified: Secondary | ICD-10-CM

## 2010-06-30 DIAGNOSIS — E119 Type 2 diabetes mellitus without complications: Secondary | ICD-10-CM

## 2010-06-30 DIAGNOSIS — G3184 Mild cognitive impairment, so stated: Secondary | ICD-10-CM

## 2010-06-30 DIAGNOSIS — Z87442 Personal history of urinary calculi: Secondary | ICD-10-CM

## 2010-06-30 DIAGNOSIS — Z8546 Personal history of malignant neoplasm of prostate: Secondary | ICD-10-CM

## 2010-07-14 ENCOUNTER — Encounter: Payer: Self-pay | Admitting: Internal Medicine

## 2010-07-14 ENCOUNTER — Telehealth (INDEPENDENT_AMBULATORY_CARE_PROVIDER_SITE_OTHER): Payer: Self-pay | Admitting: *Deleted

## 2010-07-20 NOTE — Progress Notes (Signed)
Summary: form for CPAP supplies  Phone Note From Pharmacy   Caller: [facility] Summary of Call: Form for CPAP supplies is on your desk.  I spoke with pt and he does want these supplies.                Lowella Petties CMA, AAMA  July 14, 2010 12:58 PM   Follow-up for Phone Call        form signed Follow-up by: Cindee Salt MD,  July 14, 2010 1:38 PM  Additional Follow-up for Phone Call Additional follow up Details #1::        Form faxed to [facility] Additional Follow-up by: Beau Fanny,  July 14, 2010 3:13 PM

## 2010-07-29 NOTE — Medication Information (Signed)
Summary: Order for CPAP Supplies  Order for CPAP Supplies   Imported By: Maryln Gottron 07/19/2010 15:49:37  _____________________________________________________________________  External Attachment:    Type:   Image     Comment:   External Document

## 2010-08-20 ENCOUNTER — Ambulatory Visit (INDEPENDENT_AMBULATORY_CARE_PROVIDER_SITE_OTHER): Payer: Medicare Other | Admitting: Internal Medicine

## 2010-08-20 ENCOUNTER — Encounter: Payer: Self-pay | Admitting: Internal Medicine

## 2010-08-20 VITALS — BP 120/70 | HR 68 | Temp 98.6°F | Ht 67.0 in | Wt 222.0 lb

## 2010-08-20 DIAGNOSIS — N400 Enlarged prostate without lower urinary tract symptoms: Secondary | ICD-10-CM

## 2010-08-20 DIAGNOSIS — R32 Unspecified urinary incontinence: Secondary | ICD-10-CM

## 2010-08-20 DIAGNOSIS — G3184 Mild cognitive impairment, so stated: Secondary | ICD-10-CM

## 2010-08-20 DIAGNOSIS — E119 Type 2 diabetes mellitus without complications: Secondary | ICD-10-CM

## 2010-08-20 DIAGNOSIS — I498 Other specified cardiac arrhythmias: Secondary | ICD-10-CM

## 2010-08-20 DIAGNOSIS — E039 Hypothyroidism, unspecified: Secondary | ICD-10-CM

## 2010-08-20 LAB — HEMOGLOBIN A1C: Hgb A1c MFr Bld: 5 % (ref 4.6–6.5)

## 2010-08-20 MED ORDER — BUTALBITAL-APAP-CAFFEINE 50-325-40 MG PO TABS: 1.0000 | ORAL_TABLET | Freq: Four times a day (QID) | ORAL | Status: AC | PRN

## 2010-08-20 MED ORDER — AMOXICILLIN 500 MG PO CAPS: 2000.0000 mg | ORAL_CAPSULE | Freq: Once | ORAL | Status: AC

## 2010-08-20 NOTE — Progress Notes (Signed)
Subjective:    Patient ID: [name], male    DOB: 1923/03/03, 77 y.o.   MRN: [id number]  HPI Doing well Feels fine "a little slow"---walks with cane  Asks about amoxicillin before dentist due to TKR Advised that it is probably worth continuing  Still gets occ headaches--migraines. Even occ aura which he can then  use excedrin and prevent headache Occ still gets headache Fioricet #50 has lasted since 2007 Uses rarely but needs more now  Occ upsetting dreams--fairly rare Gets disoriented and "almost lost and frantic" Affects his sleep Also nocturia--occ trouble getting back to sleep Still uses the CPAP effectively Cognitive function is stable--only rare "disconnects" over the past year Enjoys bridge and he is still able to play well  Doesn't check sugars A1c has remained normal  Current outpatient prescriptions:aspirin 325 MG tablet, Take 325 mg by mouth daily.  , Disp: , Rfl: ;  B Complex Vitamins (B COMPLEX 50) TABS, Take 1 tablet by mouth daily.  , Disp: , Rfl: ;  Cholecalciferol (VITAMIN D3) 1000 UNITS CAPS, Take 1 capsule by mouth daily.  , Disp: , Rfl: ;  cyanocobalamin 1000 MCG tablet, Inject 1,000 mcg into the muscle every 14 (fourteen) days.  , Disp: , Rfl:  donepezil (ARICEPT) 10 MG tablet, Take 10 mg by mouth at bedtime as needed.  , Disp: , Rfl: ;  fish oil-omega-3 fatty acids 1000 MG capsule, Take 1 g by mouth daily.  , Disp: , Rfl: ;  folic acid (FOLVITE) 800 MCG tablet, Take 400 mcg by mouth daily.  , Disp: , Rfl: ;  Multiple Vitamin (MULTIVITAMIN) tablet, Take 1 tablet by mouth daily.  , Disp: , Rfl: ;  oxybutynin (DITROPAN) 5 MG tablet, Take 5 mg by mouth daily.  , Disp: , Rfl:  propranolol (INDERAL LA) 60 MG 24 hr capsule, Take 60 mg by mouth daily.  , Disp: , Rfl: ;  psyllium (METAMUCIL) 58.6 % powder, Take 1 packet by mouth daily.  , Disp: , Rfl:   Past Medical History  Diagnosis Date  . Mild cognitive impairment   . Neuropathy   . History of prostate cancer      lupron/casodex x6 months then RT  . BPH (benign prostatic hypertrophy)   . Diabetes mellitus type II 2008    diet controlled  . OSA (obstructive sleep apnea)     CPAP 13  . OA (osteoarthritis)   . SVT (supraventricular tachycardia)   . History of nephrolithiasis   . Diverticulosis of colon   . Urinary incontinence     med/briefs  . Bladder spasms     Past Surgical History  Procedure Date  . Tonsillectomy   . Transurethral resection of prostate 1990  . Svt ablation 1990  . Total knee arthroplasty ~2007    Left  . Eyelid reduction 2002/2006  . Cataract extraction     OU then laser Rx to both eyes    Family History  Problem Relation Age of Onset  . Cancer Mother     breast  . Nephritis Father   . Crohn's disease Brother   . Diabetes Son   . Cancer Sister   . Coronary artery disease Neg Hx   . Hypertension Neg Hx   . Colon cancer Neg Hx     History   Social History  . Marital Status: Widowed    Spouse Name: N/A    Number of Children: 2  . Years of Education: N/A   Occupational  History  . retired     Social research officer, government; PhD in applied leadership/organizations   Social History Main Topics  . Smoking status: Former Smoker    Quit date: 05/02/1965  . Smokeless tobacco: Not on file  . Alcohol Use: Yes     occasional  . Drug Use: Not on file  . Sexually Active: Not on file   Other Topics Concern  . Not on file   Social History Narrative   Expects to make nephew Precious Gilding his health care POA--lives in [place]. Has living will. Requests DNR very strongly. Would not accept tube feedings   Review of Systems Had brief time of bluish tint under fingernails after washing hands--brief and felt okay Not overly active---trying to walk some. He does have some balance issues Weight fairly stable Occ incontinence but reasonable control on the med    Objective:   Physical Exam  Constitutional: He appears well-developed and well-nourished. No  distress.  Neck: Normal range of motion. Neck supple. No thyromegaly present.  Cardiovascular: Normal rate, regular rhythm, normal heart sounds and intact distal pulses.  Exam reveals no gallop.   No murmur heard.      Faint pedal pulses  Pulmonary/Chest: Effort normal and breath sounds normal. No respiratory distress. He has no wheezes. He has no rales.  Musculoskeletal: Normal range of motion. He exhibits no edema and no tenderness.  Lymphadenopathy:    He has no cervical adenopathy.  Psychiatric: He has a normal mood and affect. His behavior is normal. Judgment and thought content normal.          Assessment & Plan:

## 2010-10-11 ENCOUNTER — Ambulatory Visit (INDEPENDENT_AMBULATORY_CARE_PROVIDER_SITE_OTHER): Payer: Medicare Other | Admitting: Family Medicine

## 2010-10-11 ENCOUNTER — Encounter: Payer: Self-pay | Admitting: Family Medicine

## 2010-10-11 VITALS — BP 120/72 | HR 56 | Temp 97.8°F | Ht 67.0 in | Wt 222.8 lb

## 2010-10-11 DIAGNOSIS — K1370 Unspecified lesions of oral mucosa: Secondary | ICD-10-CM

## 2010-10-11 DIAGNOSIS — K121 Other forms of stomatitis: Secondary | ICD-10-CM | POA: Insufficient documentation

## 2010-10-11 MED ORDER — VALACYCLOVIR HCL 1 G PO TABS: 2000.0000 mg | ORAL_TABLET | Freq: Two times a day (BID) | ORAL | Status: AC

## 2010-10-11 NOTE — Assessment & Plan Note (Signed)
At vermillion border on inside of mouth. Could be oral herpes vs apthous ulcer. No suggestion on angioedema or meds that could contribute. Will treat with valacyclovir x 1 day, avoid irritants.

## 2010-10-11 NOTE — Patient Instructions (Signed)
Avoid citris, spicy food, tomatos. Start course of valacyclovir. Call if not improving as expected. May use ambesol for pain on lesion.

## 2010-10-11 NOTE — Progress Notes (Signed)
  Subjective:    Patient ID: [name], male    DOB: [birth year], 77 y.o.   MRN: [id number]  HPI 77 year old past smoker (20 pack year but last in 1967 (no dip, pipe etc)) noted irritation inside upper lip 1 week ago. Started using antiseptic rise. Known known lip injury. Dentures: None This morning awoke an upper lip is swollen. No lesion seen by pt.  No fever, no SOB, no difficulty swallowing.  No rash. No sore throat, slight hoarseness today.  He does have sleep apnea... Mask presses on area. No new medicines. No ACEI.    Review of Systems  Constitutional: Negative for fever, fatigue and unexpected weight change.  HENT: Negative for ear pain, congestion, rhinorrhea and postnasal drip.   Eyes: Negative for pain.  Respiratory: Negative for cough and shortness of breath.   Cardiovascular: Negative for chest pain and leg swelling.       Objective:   Physical Exam  Constitutional: Vital signs are normal. He appears well-developed and well-nourished.  Non-toxic appearance. He does not appear ill. No distress.  HENT:  Head: Normocephalic and atraumatic.  Right Ear: Hearing, tympanic membrane, external ear and ear canal normal. No tenderness. No foreign bodies. Tympanic membrane is not retracted and not bulging.  Left Ear: Hearing, tympanic membrane, external ear and ear canal normal. No tenderness. No foreign bodies. Tympanic membrane is not retracted and not bulging.  Nose: Nose normal. No mucosal edema or rhinorrhea. Right sinus exhibits no maxillary sinus tenderness and no frontal sinus tenderness. Left sinus exhibits no maxillary sinus tenderness and no frontal sinus tenderness.  Mouth/Throat: Uvula is midline, oropharynx is clear and moist and mucous membranes are normal. He does not have dentures. Oral lesions present. Abnormal dentition. Dental caries present. No oropharyngeal exudate or tonsillar abscesses.       Large white based ulcer, no blisters under upper lip at edge  of lip interiorly  Eyes: Conjunctivae, EOM and lids are normal. Pupils are equal, round, and reactive to light. No foreign bodies found.  Neck: Trachea normal, normal range of motion and phonation normal. Neck supple. Carotid bruit is not present. No mass and no thyromegaly present.  Cardiovascular: Normal rate, regular rhythm, S1 normal, S2 normal, normal heart sounds, intact distal pulses and normal pulses.  Exam reveals no gallop.   No murmur heard. Pulmonary/Chest: Effort normal and breath sounds normal. No respiratory distress. He has no wheezes. He has no rhonchi. He has no rales.  Abdominal: Soft. Normal appearance and bowel sounds are normal. There is no hepatosplenomegaly. There is no tenderness. There is no rebound, no guarding and no CVA tenderness. No hernia.  Neurological: He is alert. He has normal reflexes.  Skin: Skin is warm, dry and intact. No rash noted.  Psychiatric: He has a normal mood and affect. His speech is normal and behavior is normal. Judgment normal.          Assessment & Plan:

## 2010-12-27 ENCOUNTER — Ambulatory Visit (INDEPENDENT_AMBULATORY_CARE_PROVIDER_SITE_OTHER): Payer: Medicare Other | Admitting: Internal Medicine

## 2010-12-27 ENCOUNTER — Encounter: Payer: Self-pay | Admitting: Internal Medicine

## 2010-12-27 VITALS — BP 124/61 | HR 58 | Temp 98.5°F | Ht 67.0 in | Wt 224.0 lb

## 2010-12-27 DIAGNOSIS — K529 Noninfective gastroenteritis and colitis, unspecified: Secondary | ICD-10-CM

## 2010-12-27 DIAGNOSIS — R350 Frequency of micturition: Secondary | ICD-10-CM

## 2010-12-27 DIAGNOSIS — K5289 Other specified noninfective gastroenteritis and colitis: Secondary | ICD-10-CM

## 2010-12-27 DIAGNOSIS — R3915 Urgency of urination: Secondary | ICD-10-CM | POA: Insufficient documentation

## 2010-12-27 LAB — POCT URINALYSIS DIPSTICK
Bilirubin, UA: NEGATIVE
Leukocytes, UA: NEGATIVE
Nitrite, UA: NEGATIVE
Protein, UA: NEGATIVE
pH, UA: 7

## 2010-12-27 NOTE — Assessment & Plan Note (Signed)
No evidence of infection on urinalysis Probably bladder spasm---could be related to apparent GI illness Would treat for cystitis if develops more symptoms

## 2010-12-27 NOTE — Assessment & Plan Note (Signed)
Seems to have had a self limited GI bug System still not right but able to eat and bowels normalizing Still fatigued though

## 2010-12-27 NOTE — Progress Notes (Signed)
Subjective:    Patient ID: [name], male    DOB: [birth year], 77 y.o.   MRN: [id number]  HPI Has had symptoms for ~8-9 days Started with 2 days of diarrhea Felt weak then Bowels still slightly loose--but diarrhea is better  Now for 3 days he has had urinary urgency---then doesn't go much No dysuria but feels it is uncomfortable Still on the oxybutynin No hematuria Slight tinnitus yesterday which often happens with fever (was 99+ then)  Current Outpatient Prescriptions on File Prior to Visit  Medication Sig Dispense Refill  . aspirin 325 MG tablet Take 325 mg by mouth daily.        . B Complex Vitamins (B COMPLEX 50) TABS Take 1 tablet by mouth daily.        . butalbital-acetaminophen-caffeine (FIORICET) 50-325-40 MG per tablet Take 1-2 tablets by mouth every 6 (six) hours as needed for headache.  30 tablet  0  . Cholecalciferol (VITAMIN D3) 1000 UNITS CAPS Take 1 capsule by mouth daily.        . cyanocobalamin 1000 MCG tablet Inject 1,000 mcg into the muscle every 14 (fourteen) days.        Marland Kitchen donepezil (ARICEPT) 10 MG tablet Take 10 mg by mouth at bedtime as needed.        . fish oil-omega-3 fatty acids 1000 MG capsule Take 1 g by mouth daily.        . folic acid (FOLVITE) 800 MCG tablet Take 400 mcg by mouth daily.        . Multiple Vitamin (MULTIVITAMIN) tablet Take 1 tablet by mouth daily.        Marland Kitchen oxybutynin (DITROPAN) 5 MG tablet Take 5 mg by mouth daily.        . propranolol (INDERAL LA) 60 MG 24 hr capsule Take 60 mg by mouth daily.        . psyllium (METAMUCIL) 58.6 % powder Take 1 packet by mouth daily.          Allergies  Allergen Reactions  . Cephalexin     REACTION: intestinal distress    Past Medical History  Diagnosis Date  . Mild cognitive impairment   . Neuropathy   . History of prostate cancer     lupron/casodex x6 months then RT  . BPH (benign prostatic hypertrophy)   . Diabetes mellitus type II 2008    diet controlled  . OSA (obstructive sleep  apnea)     CPAP 13  . OA (osteoarthritis)   . SVT (supraventricular tachycardia)   . History of nephrolithiasis   . Diverticulosis of colon   . Urinary incontinence     med/briefs  . Bladder spasms     Past Surgical History  Procedure Date  . Tonsillectomy   . Transurethral resection of prostate 1990  . Svt ablation 1990  . Total knee arthroplasty ~2007    Left  . Eyelid reduction 2002/2006  . Cataract extraction     OU then laser Rx to both eyes    Family History  Problem Relation Age of Onset  . Cancer Mother     breast  . Nephritis Father   . Crohn's disease Brother   . Diabetes Son   . Cancer Sister   . Coronary artery disease Neg Hx   . Hypertension Neg Hx   . Colon cancer Neg Hx     History   Social History  . Marital Status: Widowed    Spouse Name: N/A  Number of Children: 2  . Years of Education: N/A   Occupational History  . retired     Social research officer, government; PhD in applied leadership/organizations   Social History Main Topics  . Smoking status: Former Smoker    Quit date: 05/02/1965  . Smokeless tobacco: Never Used  . Alcohol Use: Yes     occasional  . Drug Use: Not on file  . Sexually Active: Not on file   Other Topics Concern  . Not on file   Social History Narrative   Expects to make nephew Precious Gilding his health care POA--lives in [place]. Has living will. Requests DNR very strongly. Would not accept tube feedings   Review of Systems Appetite is fair No nausea or vomiting--stomach is "unsettled" though Has had a low grade headache for several days    Objective:   Physical Exam  Constitutional: He appears well-developed and well-nourished. No distress.  Neck: Normal range of motion. Neck supple.  Cardiovascular: Normal rate and regular rhythm.  Exam reveals no gallop.        occ extra beats Soft late systolic murmur  Pulmonary/Chest: Effort normal and breath sounds normal. No respiratory distress. He has no  wheezes. He has no rales.  Abdominal: Soft. Bowel sounds are normal. There is no tenderness.       No suprapubic dullness  Musculoskeletal: He exhibits no edema and no tenderness.  Lymphadenopathy:    He has no cervical adenopathy.  Psychiatric: He has a normal mood and affect. His behavior is normal. Judgment and thought content normal.          Assessment & Plan:

## 2010-12-27 NOTE — Patient Instructions (Signed)
Please call if your urinary symptoms worsen---we will try an antibiotic if that happens

## 2011-02-07 ENCOUNTER — Other Ambulatory Visit: Payer: Self-pay | Admitting: Internal Medicine

## 2011-02-25 ENCOUNTER — Ambulatory Visit: Payer: PRIVATE HEALTH INSURANCE | Admitting: Internal Medicine

## 2011-03-04 ENCOUNTER — Encounter: Payer: Self-pay | Admitting: Internal Medicine

## 2011-03-04 ENCOUNTER — Ambulatory Visit (INDEPENDENT_AMBULATORY_CARE_PROVIDER_SITE_OTHER): Payer: Medicare Other | Admitting: Internal Medicine

## 2011-03-04 VITALS — BP 152/63 | HR 57 | Ht 67.0 in | Wt 228.0 lb

## 2011-03-04 DIAGNOSIS — R32 Unspecified urinary incontinence: Secondary | ICD-10-CM

## 2011-03-04 DIAGNOSIS — E039 Hypothyroidism, unspecified: Secondary | ICD-10-CM

## 2011-03-04 DIAGNOSIS — I498 Other specified cardiac arrhythmias: Secondary | ICD-10-CM

## 2011-03-04 DIAGNOSIS — G4733 Obstructive sleep apnea (adult) (pediatric): Secondary | ICD-10-CM

## 2011-03-04 DIAGNOSIS — G3184 Mild cognitive impairment, so stated: Secondary | ICD-10-CM

## 2011-03-04 LAB — BASIC METABOLIC PANEL
BUN: 22 mg/dL (ref 6–23)
CO2: 27 mEq/L (ref 19–32)
Chloride: 104 mEq/L (ref 96–112)
Creatinine, Ser: 1 mg/dL (ref 0.4–1.5)
Glucose, Bld: 108 mg/dL — ABNORMAL HIGH (ref 70–99)
Potassium: 4.4 mEq/L (ref 3.5–5.1)

## 2011-03-04 LAB — HEPATIC FUNCTION PANEL
Bilirubin, Direct: 0.1 mg/dL (ref 0.0–0.3)
Total Bilirubin: 0.8 mg/dL (ref 0.3–1.2)

## 2011-03-04 LAB — CBC WITH DIFFERENTIAL/PLATELET
Basophils Absolute: 0 10*3/uL (ref 0.0–0.1)
Eosinophils Absolute: 0.3 10*3/uL (ref 0.0–0.7)
Lymphocytes Relative: 15.7 % (ref 12.0–46.0)
MCHC: 34.2 g/dL (ref 30.0–36.0)
Monocytes Relative: 10.4 % (ref 3.0–12.0)
Neutrophils Relative %: 69.4 % (ref 43.0–77.0)
RDW: 14.2 % (ref 11.5–14.6)

## 2011-03-04 LAB — TSH: TSH: 0.22 u[IU]/mL — ABNORMAL LOW (ref 0.35–5.50)

## 2011-03-04 LAB — T4, FREE: Free T4: 0.95 ng/dL (ref 0.60–1.60)

## 2011-03-04 NOTE — Progress Notes (Signed)
Subjective:    Patient ID: [name], male    DOB: 1922-12-23, 77 y.o.   MRN: [id number]  HPI Having ongoing urinary incontinence Not as much urgency but has had to use maxipads--this is effective Still on oxybutynin Satisfied with current treatment  Still plays competitive bridge Doesn't note any decline in his cognitive function or memory Has been travelling lately  Reviewed his medical history of the diabetes Most his A1c was ever 6% Lost 25# and has always been normal since Took the diabetes off his list CHol was 109 at last check  No chest pain No SOB No edema Tries to walk some but notes some decrease in his stamina  Current Outpatient Prescriptions on File Prior to Visit  Medication Sig Dispense Refill  . B Complex Vitamins (B COMPLEX 50) TABS Take 1 tablet by mouth daily.        . butalbital-acetaminophen-caffeine (FIORICET) 50-325-40 MG per tablet Take 1-2 tablets by mouth every 6 (six) hours as needed for headache.  30 tablet  0  . Cholecalciferol (VITAMIN D3) 1000 UNITS CAPS Take 1 capsule by mouth daily.        . cyanocobalamin 1000 MCG tablet Inject 1,000 mcg into the muscle every 14 (fourteen) days.        Marland Kitchen donepezil (ARICEPT) 10 MG tablet TAKE 1 TABLET DAILY  90 tablet  3  . fish oil-omega-3 fatty acids 1000 MG capsule Take 1 g by mouth daily.        . folic acid (FOLVITE) 800 MCG tablet Take 400 mcg by mouth daily.        . Multiple Vitamin (MULTIVITAMIN) tablet Take 1 tablet by mouth daily.        Marland Kitchen oxybutynin (DITROPAN) 5 MG tablet TAKE 1 TABLET DAILY  90 tablet  2  . propranolol (INDERAL LA) 60 MG 24 hr capsule Take 60 mg by mouth daily.        . psyllium (METAMUCIL) 58.6 % powder Take 1 packet by mouth daily.          Allergies  Allergen Reactions  . Cephalexin     REACTION: intestinal distress    Past Medical History  Diagnosis Date  . Mild cognitive impairment   . Neuropathy   . History of prostate cancer     lupron/casodex x6 months then  RT  . BPH (benign prostatic hypertrophy)   . OSA (obstructive sleep apnea)     CPAP 13  . OA (osteoarthritis)   . SVT (supraventricular tachycardia)   . History of nephrolithiasis   . Diverticulosis of colon   . Urinary incontinence     med/briefs  . Bladder spasms     Past Surgical History  Procedure Date  . Tonsillectomy   . Transurethral resection of prostate 1990  . Svt ablation 1990  . Total knee arthroplasty ~2007    Left  . Eyelid reduction 2002/2006  . Cataract extraction     OU then laser Rx to both eyes    Family History  Problem Relation Age of Onset  . Cancer Mother     breast  . Nephritis Father   . Crohn's disease Brother   . Diabetes Son   . Cancer Sister   . Coronary artery disease Neg Hx   . Hypertension Neg Hx   . Colon cancer Neg Hx     History   Social History  . Marital Status: Widowed    Spouse Name: N/A    Number  of Children: 2  . Years of Education: N/A   Occupational History  . retired     Social research officer, government; PhD in applied leadership/organizations   Social History Main Topics  . Smoking status: Former Smoker    Quit date: 05/02/1965  . Smokeless tobacco: Never Used  . Alcohol Use: Yes     occasional  . Drug Use: Not on file  . Sexually Active: Not on file   Other Topics Concern  . Not on file   Social History Narrative   Expects to make nephew Precious Gilding his health care POA--lives in [place]. Has living will. Requests DNR very strongly. Would not accept tube feedings   Review of Systems Appetite is fine Weight is stable Sleep is variable--intermittent problems No depression     Objective:   Physical Exam  Constitutional: He appears well-developed and well-nourished. No distress.  Neck: Normal range of motion. Neck supple. No thyromegaly present.  Cardiovascular: Normal rate, regular rhythm and intact distal pulses.  Exam reveals no gallop.   Murmur heard.      Late systolic murmur in mitral  area  Pulmonary/Chest: Effort normal and breath sounds normal. No respiratory distress. He has no wheezes. He has no rales.  Musculoskeletal: He exhibits no edema and no tenderness.  Lymphadenopathy:    He has no cervical adenopathy.  Skin:       No foot lesions  Psychiatric: He has a normal mood and affect. His behavior is normal. Judgment and thought content normal.          Assessment & Plan:

## 2011-03-04 NOTE — Assessment & Plan Note (Signed)
No evidence of recurrence 

## 2011-03-04 NOTE — Assessment & Plan Note (Signed)
Seems euthyroid Due for labs 

## 2011-03-04 NOTE — Assessment & Plan Note (Signed)
Does reasonably well with the CPAP

## 2011-03-04 NOTE — Assessment & Plan Note (Signed)
Still doing well No drop off in bridge skills or personal management Remains independent on all levels

## 2011-03-04 NOTE — Assessment & Plan Note (Signed)
Has to use pads now Will not increase meds due to risk of worsening cognition

## 2011-03-08 ENCOUNTER — Encounter: Payer: Self-pay | Admitting: *Deleted

## 2011-03-10 ENCOUNTER — Telehealth: Payer: Self-pay | Admitting: Internal Medicine

## 2011-03-10 NOTE — Telephone Encounter (Signed)
Phone call from [facility] at [facility] In clinic there with sudden loss of hearing in left ear----even with hearing aide No other symptoms  She will set up with ENT today for eval

## 2011-03-15 ENCOUNTER — Other Ambulatory Visit: Payer: Self-pay | Admitting: *Deleted

## 2011-03-15 NOTE — Telephone Encounter (Addendum)
Per fax Cyanocobalamin is on back order, please advise on what can substitute., Form on your desk

## 2011-03-16 MED ORDER — CYANOCOBALAMIN 1000 MCG/ML IJ SOLN: 1000.0000 ug | INTRAMUSCULAR | Status: DC

## 2011-03-16 NOTE — Telephone Encounter (Signed)
okay

## 2011-03-16 NOTE — Telephone Encounter (Signed)
Tried calling pt to advise of backorder, no answer, no answering machine.

## 2011-03-16 NOTE — Telephone Encounter (Signed)
[facility] stores and the only one that had some was [facility] [address], rx sent there, still trying to reach pt to advise results.

## 2011-03-16 NOTE — Telephone Encounter (Signed)
Please check with local pharmacy of his about any alternatives Don't really know what to order unless I know what is available

## 2011-03-16 NOTE — Telephone Encounter (Signed)
Addended by: Sueanne Margarita on: 03/16/2011 10:09 AM   Modules accepted: Orders

## 2011-03-16 NOTE — Telephone Encounter (Signed)
New rx sent for 1ml vial to [facility]

## 2011-03-16 NOTE — Telephone Encounter (Signed)
Call back from [facility], 1 ml vial that was sent in is on backorder, as well as 10 ml vials.

## 2011-03-28 ENCOUNTER — Telehealth: Payer: Self-pay | Admitting: *Deleted

## 2011-03-28 NOTE — Telephone Encounter (Signed)
Spoke with patient and advised I had found some for him at [facility] [address], patient stated he will pick this up.

## 2011-03-28 NOTE — Telephone Encounter (Signed)
Patient called stating that he has been notified by [facility] that they can not get his Vitamin B-12 injectable from their supplier. Patient states that he has been getting this from his pharmacy and Angelica Chessman at [facility] gives it to him. Patient wants to know what you suggest?

## 2011-03-28 NOTE — Telephone Encounter (Signed)
If we can't find any at another pharmacy, should be safe for him to wait a couple of months till back orders are filled

## 2011-04-11 ENCOUNTER — Emergency Department: Payer: Self-pay | Admitting: *Deleted

## 2011-05-02 ENCOUNTER — Encounter: Payer: Self-pay | Admitting: Otolaryngology

## 2011-05-03 ENCOUNTER — Encounter: Payer: Self-pay | Admitting: Otolaryngology

## 2011-06-01 ENCOUNTER — Encounter: Payer: Self-pay | Admitting: Internal Medicine

## 2011-06-01 ENCOUNTER — Ambulatory Visit (INDEPENDENT_AMBULATORY_CARE_PROVIDER_SITE_OTHER): Payer: Medicare Other | Admitting: Internal Medicine

## 2011-06-01 ENCOUNTER — Ambulatory Visit: Payer: Medicare Other | Admitting: Internal Medicine

## 2011-06-01 DIAGNOSIS — R42 Dizziness and giddiness: Secondary | ICD-10-CM

## 2011-06-01 DIAGNOSIS — H8190 Unspecified disorder of vestibular function, unspecified ear: Secondary | ICD-10-CM

## 2011-06-01 NOTE — Progress Notes (Signed)
Subjective:    Patient ID: [name], male    DOB: 05-01-1923, 78 y.o.   MRN: [id number]  HPI Reviewed concerns from [facility] RN at [facility]  Had recurrence of sudden deafness in left ear Had always responded to prednisone in past Not sleeping well with this---tapering off now Does seem to have helped  1 week ago Had momentary period of disorientation Not dizzy or vertigo but felt he wouldn't be able to stand up Was very "tippy" with walk and uncomfortable walking since  Finds a lack of energy--has to push himself. ??related to sleep issues Broke out in sweat while having coffee with friends this AM  No fever No diplopia or vision change No speech or swallowing problems No focal weakness but thighs "feel funny" when he gets tired  Had "crash" of fatigue 2 days ago at ~ noon and slept for an hour Really felt great for a while  Current Outpatient Prescriptions on File Prior to Visit  Medication Sig Dispense Refill  . aspirin 81 MG tablet Take 81 mg by mouth daily.        . B Complex Vitamins (B COMPLEX 50) TABS Take 1 tablet by mouth daily.        . butalbital-acetaminophen-caffeine (FIORICET) 50-325-40 MG per tablet Take 1-2 tablets by mouth every 6 (six) hours as needed for headache.  30 tablet  0  . Cholecalciferol (VITAMIN D3) 1000 UNITS CAPS Take 1 capsule by mouth daily.        . cyanocobalamin (,VITAMIN B-12,) 1000 MCG/ML injection Inject 1 mL (1,000 mcg total) into the muscle every 14 (fourteen) days.  10 mL  0  . donepezil (ARICEPT) 10 MG tablet TAKE 1 TABLET DAILY  90 tablet  3  . fish oil-omega-3 fatty acids 1000 MG capsule Take 1 g by mouth daily.        . folic acid (FOLVITE) 800 MCG tablet Take 400 mcg by mouth daily.        . Multiple Vitamin (MULTIVITAMIN) tablet Take 1 tablet by mouth daily.        Marland Kitchen oxybutynin (DITROPAN) 5 MG tablet TAKE 1 TABLET DAILY  90 tablet  2  . propranolol (INDERAL LA) 60 MG 24 hr capsule Take 60 mg by mouth daily.        . psyllium  (METAMUCIL) 58.6 % powder Take 1 packet by mouth daily.          Allergies  Allergen Reactions  . Cephalexin     REACTION: intestinal distress    Past Medical History  Diagnosis Date  . Mild cognitive impairment   . Neuropathy   . History of prostate cancer     lupron/casodex x6 months then RT  . BPH (benign prostatic hypertrophy)   . OSA (obstructive sleep apnea)     CPAP 13  . OA (osteoarthritis)   . SVT (supraventricular tachycardia)   . History of nephrolithiasis   . Diverticulosis of colon   . Urinary incontinence     med/briefs  . Bladder spasms     Past Surgical History  Procedure Date  . Tonsillectomy   . Transurethral resection of prostate 1990  . Svt ablation 1990  . Total knee arthroplasty ~2007    Left  . Eyelid reduction 2002/2006  . Cataract extraction     OU then laser Rx to both eyes    Family History  Problem Relation Age of Onset  . Cancer Mother     breast  .  Nephritis Father   . Crohn's disease Brother   . Diabetes Son   . Cancer Sister   . Coronary artery disease Neg Hx   . Hypertension Neg Hx   . Colon cancer Neg Hx     History   Social History  . Marital Status: Widowed    Spouse Name: N/A    Number of Children: 2  . Years of Education: N/A   Occupational History  . retired     Social research officer, government; PhD in applied leadership/organizations   Social History Main Topics  . Smoking status: Former Smoker    Quit date: 05/02/1965  . Smokeless tobacco: Never Used  . Alcohol Use: Yes     occasional  . Drug Use: Not on file  . Sexually Active: Not on file   Other Topics Concern  . Not on file   Social History Narrative   Expects to make nephew Precious Gilding his health care POA--lives in [place]. Has living will. Requests DNR very strongly. Would not accept tube feedings       Review of Systems Still keeps up with bridge---has come in first recently Appetite is up on the prednisone Trying low salt diet  per ENT---finally starting to comply with this    Objective:   Physical Exam  Constitutional: He appears well-developed and well-nourished. No distress.  HENT:  Mouth/Throat: Oropharynx is clear and moist. No oropharyngeal exudate.  Eyes: Conjunctivae and EOM are normal. Pupils are equal, round, and reactive to light.       No nystagmus  Cardiovascular: Normal rate, regular rhythm and normal heart sounds.  Exam reveals no gallop.   No murmur heard. Pulmonary/Chest: Effort normal and breath sounds normal. No respiratory distress. He has no wheezes. He has no rales.  Musculoskeletal: He exhibits no edema and no tenderness.  Neurological: He is alert. He has normal strength. He displays no tremor. No cranial nerve deficit. He exhibits normal muscle tone. He displays a negative Romberg sign. Coordination and gait normal.       Normal gait with cane  Psychiatric: He has a normal mood and affect. His behavior is normal. Judgment and thought content normal.          Assessment & Plan:

## 2011-06-01 NOTE — Assessment & Plan Note (Signed)
Does not have findings consistent with brain stem infarct Probably vestibular Dr Andee Poles has planned MRI  Sleep disturbance is adding to this with fatigue Hopefully with sleep better with prednisone wean--did feel better after good nap

## 2011-06-03 ENCOUNTER — Encounter: Payer: Self-pay | Admitting: Otolaryngology

## 2011-06-07 ENCOUNTER — Telehealth: Payer: Self-pay | Admitting: Internal Medicine

## 2011-06-07 NOTE — Telephone Encounter (Signed)
Doctor is requesting lab results from recent office visit to be faxed to [phone] attention [place].  Call back [phone]. Thanks

## 2011-06-07 NOTE — Telephone Encounter (Signed)
Results faxed.

## 2011-06-08 ENCOUNTER — Telehealth: Payer: Self-pay | Admitting: Internal Medicine

## 2011-06-08 NOTE — Telephone Encounter (Signed)
Calling Lenora @ [facility] to see what medication they are asking about. Marland Kitchenleft message to have Kendal Hymen return my call.

## 2011-06-08 NOTE — Telephone Encounter (Signed)
Spoke with Kendal Hymen and they would like to try dyazide 37.5 once daily, pt is having some hearing loss and Dr.Vault think mild meniere's disease. They are also faxing over pt's last office note, Form on your desk

## 2011-06-09 NOTE — Telephone Encounter (Signed)
Faxed back to [facility]

## 2011-06-09 NOTE — Telephone Encounter (Signed)
I am fine with this Let them know and add to his med list

## 2011-06-11 ENCOUNTER — Inpatient Hospital Stay: Payer: Self-pay | Admitting: Internal Medicine

## 2011-06-11 LAB — BASIC METABOLIC PANEL
Anion Gap: 11 (ref 7–16)
Chloride: 106 mmol/L (ref 98–107)
Co2: 26 mmol/L (ref 21–32)
Creatinine: 0.95 mg/dL (ref 0.60–1.30)
EGFR (African American): >60
Osmolality: 292 (ref 275–301)
Sodium: 143 mmol/L (ref 136–145)

## 2011-06-11 LAB — CBC
HGB: 13.9 g/dL (ref 13.0–18.0)
MCV: 95 fL (ref 80–100)
Platelet: 82 10*3/uL — ABNORMAL LOW (ref 150–440)
RBC: 4.26 10*6/uL — ABNORMAL LOW (ref 4.40–5.90)
RDW: 14.6 % — ABNORMAL HIGH (ref 11.5–14.5)
WBC: 6.1 10*3/uL (ref 3.8–10.6)

## 2011-06-12 LAB — CBC WITH DIFFERENTIAL/PLATELET
Basophil %: 0.4 %
Eosinophil #: 0.2 10*3/uL (ref 0.0–0.7)
Eosinophil %: 3 %
Lymphocyte #: 1 10*3/uL (ref 1.0–3.6)
Lymphocyte %: 14.7 %
MCH: 32.8 pg (ref 26.0–34.0)
MCHC: 34.4 g/dL (ref 32.0–36.0)
MCV: 95 fL (ref 80–100)
Monocyte #: 0.6 10*3/uL (ref 0.0–0.7)
Neutrophil %: 72 %
Platelet: 85 10*3/uL — ABNORMAL LOW (ref 150–440)
RBC: 3.87 10*6/uL — ABNORMAL LOW (ref 4.40–5.90)

## 2011-06-20 ENCOUNTER — Encounter: Payer: Self-pay | Admitting: Internal Medicine

## 2011-06-20 ENCOUNTER — Ambulatory Visit (INDEPENDENT_AMBULATORY_CARE_PROVIDER_SITE_OTHER): Payer: Medicare Other | Admitting: Internal Medicine

## 2011-06-20 DIAGNOSIS — L03119 Cellulitis of unspecified part of limb: Secondary | ICD-10-CM

## 2011-06-20 DIAGNOSIS — R42 Dizziness and giddiness: Secondary | ICD-10-CM

## 2011-06-20 DIAGNOSIS — L03116 Cellulitis of left lower limb: Secondary | ICD-10-CM

## 2011-06-20 DIAGNOSIS — H8190 Unspecified disorder of vestibular function, unspecified ear: Secondary | ICD-10-CM

## 2011-06-20 MED ORDER — CYANOCOBALAMIN 1000 MCG/ML IJ SOLN: 1000.0000 ug | INTRAMUSCULAR | Status: DC

## 2011-06-20 NOTE — Progress Notes (Signed)
Subjective:    Patient ID: [name], male    DOB: [birth year], 78 y.o.   MRN: [id number]  HPI Hospitalized at [facility] and discharged 8 days ago Had noted swelling and redness in left foot Tried soaking it  Uncomfortable and then was restless in bed Went to ER and admitted  Treated with clindamycin May have had blister that ruptured and caused the infection Finished course 3 days ago  Foot is better Did see podiatrist last Thursday---debrided some of the callous that is still there  Current Outpatient Prescriptions on File Prior to Visit  Medication Sig Dispense Refill  . aspirin 81 MG tablet Take 81 mg by mouth daily.        . B Complex Vitamins (B COMPLEX 50) TABS Take 1 tablet by mouth daily.        . butalbital-acetaminophen-caffeine (FIORICET) 50-325-40 MG per tablet Take 1-2 tablets by mouth every 6 (six) hours as needed for headache.  30 tablet  0  . Cholecalciferol (VITAMIN D3) 1000 UNITS CAPS Take 1 capsule by mouth daily.        Marland Kitchen donepezil (ARICEPT) 10 MG tablet TAKE 1 TABLET DAILY  90 tablet  3  . fish oil-omega-3 fatty acids 1000 MG capsule Take 1 g by mouth daily.        . folic acid (FOLVITE) 800 MCG tablet Take 400 mcg by mouth daily.        . Multiple Vitamin (MULTIVITAMIN) tablet Take 1 tablet by mouth daily.        Marland Kitchen oxybutynin (DITROPAN) 5 MG tablet TAKE 1 TABLET DAILY  90 tablet  2  . propranolol (INDERAL LA) 60 MG 24 hr capsule Take 60 mg by mouth daily.        . psyllium (METAMUCIL) 58.6 % powder Take 1 packet by mouth daily.        Marland Kitchen triamterene-hydrochlorothiazide (DYAZIDE) 37.5-25 MG per capsule Take 1 capsule by mouth daily.        Allergies  Allergen Reactions  . Cephalexin     REACTION: intestinal distress    Past Medical History  Diagnosis Date  . Mild cognitive impairment   . Neuropathy   . History of prostate cancer     lupron/casodex x6 months then RT  . BPH (benign prostatic hypertrophy)   . OSA (obstructive sleep apnea)     CPAP 13  .  OA (osteoarthritis)   . SVT (supraventricular tachycardia)   . History of nephrolithiasis   . Diverticulosis of colon   . Urinary incontinence     med/briefs  . Bladder spasms     Past Surgical History  Procedure Date  . Tonsillectomy   . Transurethral resection of prostate 1990  . Svt ablation 1990  . Total knee arthroplasty ~2007    Left  . Eyelid reduction 2002/2006  . Cataract extraction     OU then laser Rx to both eyes    Family History  Problem Relation Age of Onset  . Cancer Mother     breast  . Nephritis Father   . Crohn's disease Brother   . Diabetes Son   . Cancer Sister   . Coronary artery disease Neg Hx   . Hypertension Neg Hx   . Colon cancer Neg Hx     History   Social History  . Marital Status: Widowed    Spouse Name: N/A    Number of Children: 2  . Years of Education: N/A   Occupational History  .  retired     Social research officer, government; PhD in applied leadership/organizations   Social History Main Topics  . Smoking status: Former Smoker    Quit date: 05/02/1965  . Smokeless tobacco: Never Used  . Alcohol Use: Yes     occasional  . Drug Use: Not on file  . Sexually Active: Not on file   Other Topics Concern  . Not on file   Social History Narrative   Expects to make nephew Precious Gilding his health care POA--lives in [place]. Has living will. Requests DNR very strongly. Would not accept tube feedings   Review of Systems No fever Slight loose stools on the med---some urgency (not diarrhea though)     Objective:   Physical Exam  Constitutional: He appears well-developed and well-nourished. No distress.  Skin:       Small open area at tip of left 3rd toe No inflammation No redness, warmth or tenderness          Assessment & Plan:

## 2011-06-20 NOTE — Assessment & Plan Note (Signed)
Resolved Done with clindamycin Advised to keep the callous that reforms---it may protect the toe from opening again and getting infected

## 2011-06-20 NOTE — Assessment & Plan Note (Signed)
On the dyazide per Dr Andee Poles Does seem some better

## 2011-07-01 ENCOUNTER — Encounter: Payer: Self-pay | Admitting: Otolaryngology

## 2011-07-11 ENCOUNTER — Ambulatory Visit: Payer: Self-pay | Admitting: Otolaryngology

## 2011-07-11 LAB — CREATININE, SERUM
Creatinine: 1.16 mg/dL (ref 0.60–1.30)
EGFR (African American): >60
EGFR (Non-African Amer.): >60

## 2011-07-19 ENCOUNTER — Ambulatory Visit: Payer: Self-pay | Admitting: Otolaryngology

## 2011-07-19 ENCOUNTER — Other Ambulatory Visit: Payer: Self-pay | Admitting: *Deleted

## 2011-07-19 MED ORDER — PROPRANOLOL HCL ER 60 MG PO CP24: 60.0000 mg | ORAL_CAPSULE | Freq: Every day | ORAL | Status: DC

## 2011-08-11 ENCOUNTER — Telehealth: Payer: Self-pay | Admitting: *Deleted

## 2011-08-11 NOTE — Telephone Encounter (Signed)
[name], [place] DOB 2022-08-28 left v/m returning your call and can be reached until noon at [phone]. rena

## 2011-08-11 NOTE — Telephone Encounter (Signed)
Calling pt to see if he will be using [facility] for his CPAP supplies  .left message to have patient return my call.

## 2011-08-11 NOTE — Telephone Encounter (Signed)
Spoke with patient and he will be using this company, form faxed back.

## 2011-09-23 ENCOUNTER — Ambulatory Visit: Payer: Medicare Other | Admitting: Internal Medicine

## 2011-09-27 ENCOUNTER — Ambulatory Visit: Payer: Medicare Other | Admitting: Internal Medicine

## 2011-10-12 ENCOUNTER — Ambulatory Visit (INDEPENDENT_AMBULATORY_CARE_PROVIDER_SITE_OTHER): Payer: Medicare Other | Admitting: Internal Medicine

## 2011-10-12 ENCOUNTER — Other Ambulatory Visit: Payer: Self-pay

## 2011-10-12 ENCOUNTER — Encounter: Payer: Self-pay | Admitting: Internal Medicine

## 2011-10-12 VITALS — BP 118/70 | HR 78 | Temp 98.7°F | Ht 67.0 in | Wt 227.0 lb

## 2011-10-12 DIAGNOSIS — H8190 Unspecified disorder of vestibular function, unspecified ear: Secondary | ICD-10-CM

## 2011-10-12 DIAGNOSIS — N400 Enlarged prostate without lower urinary tract symptoms: Secondary | ICD-10-CM

## 2011-10-12 DIAGNOSIS — I498 Other specified cardiac arrhythmias: Secondary | ICD-10-CM

## 2011-10-12 DIAGNOSIS — G3184 Mild cognitive impairment, so stated: Secondary | ICD-10-CM

## 2011-10-12 DIAGNOSIS — R42 Dizziness and giddiness: Secondary | ICD-10-CM

## 2011-10-12 MED ORDER — OXYBUTYNIN CHLORIDE 5 MG PO TABS: 5.0000 mg | ORAL_TABLET | Freq: Every day | ORAL | Status: DC

## 2011-10-12 NOTE — Progress Notes (Signed)
Subjective:    Patient ID: [name], male    DOB: 11-Jun-1922, 78 y.o.   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 aspirin 81 MG tablet Take 81 mg by mouth daily.        . B Complex Vitamins (B COMPLEX 50) TABS Take 1 tablet by mouth daily.        . Cholecalciferol (VITAMIN D3) 1000 UNITS CAPS Take 1 capsule by mouth daily.        Marland Kitchen COLCRYS 0.6 MG tablet Take 0.6 mg by mouth daily as needed.       . cyanocobalamin (,VITAMIN B-12,) 1000 MCG/ML injection Inject 1 mL (1,000 mcg total) into the muscle every 14 (fourteen) days.  30 mL  3  . donepezil (ARICEPT) 10 MG tablet TAKE 1 TABLET DAILY  90 tablet  3  . fish oil-omega-3 fatty acids 1000 MG capsule Take 1 g by mouth daily.        . folic acid (FOLVITE) 800 MCG tablet Take 400 mcg by mouth daily.        . Multiple Vitamin (MULTIVITAMIN) tablet Take 1 tablet by mouth daily.        Marland Kitchen oxybutynin (DITROPAN) 5 MG tablet TAKE 1 TABLET DAILY  90 tablet  2  . propranolol (INDERAL LA) 60 MG 24 hr capsule Take 1 capsule  (60 mg total) by mouth daily.  90 capsule  3  . psyllium (METAMUCIL) 58.6 % powder Take 1 packet by mouth daily.        Marland Kitchen triamterene-hydrochlorothiazide (DYAZIDE) 37.5-25 MG per capsule Take 1 capsule by mouth daily.        Allergies  Allergen Reactions  . Cephalexin     REACTION: intestinal distress    Past Medical History  Diagnosis Date  . Mild cognitive impairment   . Neuropathy   . History of prostate cancer     lupron/casodex x6 months then RT  . BPH (benign prostatic hypertrophy)   . OSA (obstructive sleep apnea)     CPAP 13  . OA (osteoarthritis)   . SVT (supraventricular tachycardia)   . History of nephrolithiasis   . Diverticulosis of colon   . Urinary incontinence     med/briefs  . Bladder spasms     Past Surgical History  Procedure Date  . Tonsillectomy   . Transurethral resection of prostate 1990  . Svt ablation 1990  . Total knee arthroplasty ~2007    Left  . Eyelid reduction 2002/2006  . Cataract extraction     OU then laser  Rx to both eyes    Family History  Problem Relation Age of Onset  . Cancer Mother     breast  . Nephritis Father   . Crohn's disease Brother   . Diabetes Son   . Cancer Sister   . Coronary artery disease Neg Hx   . Hypertension Neg Hx   . Colon cancer Neg Hx     History   Social History  . Marital Status: Widowed    Spouse Name: N/A    Number of Children: 2  . Years of Education: N/A   Occupational History  . retired     Social research officer, government; PhD in applied leadership/organizations   Social History Main Topics  . Smoking status: Former Smoker    Quit date: 05/02/1965  . Smokeless tobacco: Never Used  . Alcohol Use: Yes     occasional  . Drug Use: Not on file  . Sexually Active: Not on file   Other Topics Concern  . Not on file   Social History Narrative   Expects to make nephew Precious Gilding his health care POA--lives in [place]. Has living will. Requests DNR very strongly. Would not  accept tube feedings   Review of Systems Appetite is fine Weight stable Sleeps okay if not on prednisone    Objective:   Physical Exam  Constitutional: He appears well-developed and well-nourished. No distress.  Neck: Normal range of motion. Neck supple.  Cardiovascular: Normal rate, regular rhythm and intact distal pulses.  Exam reveals no gallop.   Murmur heard.      Soft systolic murmur towards apex  Pulmonary/Chest: Effort normal and breath sounds normal. No respiratory distress. He has no wheezes. He has no rales.  Musculoskeletal: He exhibits no edema.  Lymphadenopathy:    He has no cervical adenopathy.  Skin:       No foot lesions Mycotic toenails  Psychiatric: He has a normal mood and affect. His behavior is normal.          Assessment & Plan:

## 2011-10-12 NOTE — Assessment & Plan Note (Signed)
No evident recurrence. 

## 2011-10-12 NOTE — Telephone Encounter (Signed)
[facility] request oxybutynin 5 mg # 90 x 3.

## 2011-10-12 NOTE — Assessment & Plan Note (Signed)
Significant evaluation Now on dyazide Prednisone for hearing loss spells

## 2011-10-12 NOTE — Assessment & Plan Note (Signed)
Stable cognitive function Will continue the donepezil

## 2011-10-12 NOTE — Assessment & Plan Note (Signed)
Voiding okay Will continue the oxybutynin

## 2011-10-31 ENCOUNTER — Other Ambulatory Visit: Payer: Self-pay | Admitting: Internal Medicine

## 2011-11-18 ENCOUNTER — Encounter: Payer: Self-pay | Admitting: Internal Medicine

## 2011-11-18 ENCOUNTER — Ambulatory Visit (INDEPENDENT_AMBULATORY_CARE_PROVIDER_SITE_OTHER): Payer: Medicare Other | Admitting: Internal Medicine

## 2011-11-18 VITALS — BP 130/80 | HR 75 | Temp 98.7°F | Wt 227.0 lb

## 2011-11-18 DIAGNOSIS — M5430 Sciatica, unspecified side: Secondary | ICD-10-CM | POA: Insufficient documentation

## 2011-11-18 NOTE — Assessment & Plan Note (Signed)
Brief radicular pain Better now Reassured Observation only

## 2011-11-18 NOTE — Progress Notes (Signed)
Subjective:    Patient ID: [name], male    DOB: [birth year], 78 y.o.   MRN: [id number]  HPI Developed severe posterior right hip pain which radiated down leg yesterday Taking aspirin Was in [place] playing bridge and he had terrible pain just getting up to change seats Dr Gavin Potters returned his call from yesterday with recommendations of what he can take with the MTX  After nap of 5 hours yesterday, it seemed better Not bad today---only occ twinge  Current Outpatient Prescriptions on File Prior to Visit  Medication Sig Dispense Refill  . allopurinol (ZYLOPRIM) 100 MG tablet Take 50 mg by mouth daily.      Marland Kitchen aspirin 81 MG tablet Take 81 mg by mouth daily.        . B Complex Vitamins (B COMPLEX 50) TABS Take 1 tablet by mouth daily.        . Cholecalciferol (VITAMIN D3) 1000 UNITS CAPS Take 1 capsule by mouth daily.        Marland Kitchen COLCRYS 0.6 MG tablet Take 0.6 mg by mouth daily as needed.       . cyanocobalamin (,VITAMIN B-12,) 1000 MCG/ML injection INJECT INTRAMUSCULARLY EVERY 2 WEEKS. DISCARD 28  DAYS AFTER FIRST USE.  30 mL  3  . donepezil (ARICEPT) 10 MG tablet TAKE 1 TABLET DAILY  90 tablet  3  . fish oil-omega-3 fatty acids 1000 MG capsule Take 1 g by mouth daily.        . folic acid (FOLVITE) 800 MCG tablet Take 400 mcg by mouth daily.        . methotrexate (RHEUMATREX) 2.5 MG tablet Take 15 mg by mouth once a week.       . Multiple Vitamin (MULTIVITAMIN) tablet Take 1 tablet by mouth daily.        Marland Kitchen oxybutynin (DITROPAN) 5 MG tablet Take 1 tablet (5 mg total) by mouth daily.  90 tablet  3  . propranolol (INDERAL LA) 60 MG 24 hr capsule Take 1 capsule (60 mg total) by mouth daily.  90 capsule  3  . psyllium (METAMUCIL) 58.6 % powder Take 1 packet by mouth daily.        Marland Kitchen triamterene-hydrochlorothiazide (DYAZIDE) 37.5-25 MG per capsule Take 1 capsule by mouth daily.        Allergies  Allergen Reactions  . Cephalexin     REACTION: intestinal distress    Past Medical  History  Diagnosis Date  . Mild cognitive impairment   . Neuropathy   . History of prostate cancer     lupron/casodex x6 months then RT  . BPH (benign prostatic hypertrophy)   . OSA (obstructive sleep apnea)     CPAP 13  . OA (osteoarthritis)   . SVT (supraventricular tachycardia)   . History of nephrolithiasis   . Diverticulosis of colon   . Urinary incontinence     med/briefs  . Bladder spasms     Past Surgical History  Procedure Date  . Tonsillectomy   . Transurethral resection of prostate 1990  . Svt ablation 1990  . Total knee arthroplasty ~2007    Left  . Eyelid reduction 2002/2006  . Cataract extraction     OU then laser Rx to both eyes    Family History  Problem Relation Age of Onset  . Cancer Mother     breast  . Nephritis Father   . Crohn's disease Brother   . Diabetes Son   . Cancer Sister   .  Coronary artery disease Neg Hx   . Hypertension Neg Hx   . Colon cancer Neg Hx     History   Social History  . Marital Status: Widowed    Spouse Name: N/A    Number of Children: 2  . Years of Education: N/A   Occupational History  . retired     Social research officer, government; PhD in applied leadership/organizations   Social History Main Topics  . Smoking status: Former Smoker    Quit date: 05/02/1965  . Smokeless tobacco: Never Used  . Alcohol Use: Yes     occasional  . Drug Use: Not on file  . Sexually Active: Not on file   Other Topics Concern  . Not on file   Social History Narrative   Expects to make nephew Precious Gilding his health care POA--lives in [place]. Has living will. Requests DNR very strongly. Would not accept tube feedings   Review of Systems Had sense that the leg wouldn't support him, but never fell No change in bowel or bladder habits    Objective:   Physical Exam  Constitutional: He appears well-developed and well-nourished. No distress.  Musculoskeletal:       No spine tenderness Pain spot is right S-I--though  okay now Normal passive ROM of hips  Neurological: He displays normal reflexes.       Walks slowly but not antalgic No focal weakness in LE          Assessment & Plan:

## 2011-12-07 ENCOUNTER — Telehealth: Payer: Self-pay

## 2011-12-07 DIAGNOSIS — M5430 Sciatica, unspecified side: Secondary | ICD-10-CM

## 2011-12-07 NOTE — Telephone Encounter (Signed)
Pt left v/m sciatica has returned; saw Dr Alphonsus Sias 11/18/11 for sciatica rt hip and down leg. Pt wants PT or ortho referral.Please advise.

## 2011-12-12 NOTE — Telephone Encounter (Signed)
Appt  made with Roseanne Reno PT and patient notified. MK

## 2012-01-03 ENCOUNTER — Other Ambulatory Visit: Payer: Self-pay | Admitting: *Deleted

## 2012-01-03 MED ORDER — DONEPEZIL HCL 10 MG PO TABS: 10.0000 mg | ORAL_TABLET | Freq: Every day | ORAL | Status: DC

## 2012-01-12 ENCOUNTER — Ambulatory Visit (INDEPENDENT_AMBULATORY_CARE_PROVIDER_SITE_OTHER): Payer: Medicare Other | Admitting: Internal Medicine

## 2012-01-12 ENCOUNTER — Encounter: Payer: Self-pay | Admitting: Internal Medicine

## 2012-01-12 VITALS — BP 118/70 | HR 75 | Temp 99.1°F | Ht 67.0 in | Wt 220.0 lb

## 2012-01-12 DIAGNOSIS — J029 Acute pharyngitis, unspecified: Secondary | ICD-10-CM | POA: Insufficient documentation

## 2012-01-12 LAB — POCT RAPID STREP A (OFFICE): Rapid Strep A Screen: NEGATIVE

## 2012-01-12 MED ORDER — MAGIC MOUTHWASH: 5.0000 mL | ORAL | Status: DC | PRN

## 2012-01-12 NOTE — Assessment & Plan Note (Signed)
Strep was negative Has fairly widespread erythema Will treat with Dukes swish and spit Analgesics prn

## 2012-01-12 NOTE — Progress Notes (Signed)
Subjective:    Patient ID: [name], male    DOB: Dec 23, 1922, 78 y.o.   MRN: [id number]  HPI Has had a sore throat for about 4 days Generally would get better on its own but tis has persisted Tries cepacol---some help but then not last night Having odynophagia Pain moves---from side to side or in middle Now feels up more towards uvula Irritation under tongue also  Didn't feel fever at home Slight cough---a little phlegm that he swallows Lots of drippy nose but not much congestion. Not sure about PND No ear pain No SOB or at most just a touch (like with extended walking)  Current Outpatient Prescriptions on File Prior to Visit  Medication Sig Dispense Refill  . allopurinol (ZYLOPRIM) 100 MG tablet Take 50 mg by mouth daily.      Marland Kitchen aspirin 81 MG tablet Take 81 mg by mouth daily.        . B Complex Vitamins (B COMPLEX 50) TABS Take 1 tablet by mouth daily.        . Cholecalciferol (VITAMIN D3) 1000 UNITS CAPS Take 1 capsule by mouth daily.        Marland Kitchen COLCRYS 0.6 MG tablet Take 0.6 mg by mouth daily as needed.       . cyanocobalamin (,VITAMIN B-12,) 1000 MCG/ML injection INJECT INTRAMUSCULARLY EVERY 2 WEEKS. DISCARD 28  DAYS AFTER FIRST USE.  30 mL  3  . donepezil (ARICEPT) 10 MG tablet Take 1 tablet (10 mg total) by mouth at bedtime.  90 tablet  3  . fish oil-omega-3 fatty acids 1000 MG capsule Take 1 g by mouth daily.        . folic acid (FOLVITE) 800 MCG tablet Take 1,600 mcg by mouth daily.       . methotrexate (RHEUMATREX) 2.5 MG tablet Take 15 mg by mouth once a week.       . Multiple Vitamin (MULTIVITAMIN) tablet Take 1 tablet by mouth daily.        Marland Kitchen oxybutynin (DITROPAN) 5 MG tablet Take 1 tablet (5 mg total) by mouth daily.  90 tablet  3  . propranolol (INDERAL LA) 60 MG 24 hr capsule Take 1 capsule (60 mg total) by mouth daily.  90 capsule  3  . psyllium (METAMUCIL) 58.6 % powder Take 1 packet by mouth daily.        Marland Kitchen triamterene-hydrochlorothiazide (DYAZIDE) 37.5-25  MG per capsule Take 1 capsule by mouth daily.        Allergies  Allergen Reactions  . Cephalexin     REACTION: intestinal distress    Past Medical History  Diagnosis Date  . Mild cognitive impairment   . Neuropathy   . History of prostate cancer     lupron/casodex x6 months then RT  . BPH (benign prostatic hypertrophy)   . OSA (obstructive sleep apnea)     CPAP 13  . OA (osteoarthritis)   . SVT (supraventricular tachycardia)   . History of nephrolithiasis   . Diverticulosis of colon   . Urinary incontinence     med/briefs  . Bladder spasms     Past Surgical History  Procedure Date  . Tonsillectomy   . Transurethral resection of prostate 1990  . Svt ablation 1990  . Total knee arthroplasty ~2007    Left  . Eyelid reduction 2002/2006  . Cataract extraction     OU then laser Rx to both eyes    Family History  Problem Relation Age of  Onset  . Cancer Mother     breast  . Nephritis Father   . Crohn's disease Brother   . Diabetes Son   . Cancer Sister   . Coronary artery disease Neg Hx   . Hypertension Neg Hx   . Colon cancer Neg Hx     History   Social History  . Marital Status: Widowed    Spouse Name: N/A    Number of Children: 2  . Years of Education: N/A   Occupational History  . retired     Social research officer, government; PhD in applied leadership/organizations   Social History Main Topics  . Smoking status: Former Smoker    Quit date: 05/02/1965  . Smokeless tobacco: Never Used  . Alcohol Use: Yes     occasional  . Drug Use: Not on file  . Sexually Active: Not on file   Other Topics Concern  . Not on file   Social History Narrative   Expects to make nephew Precious Gilding his health care POA--lives in [place]. Has living will. Requests DNR very strongly. Would not accept tube feedings   Review of Systems Appetite is off. Is able to eat some Some "queasiness" Does have itchy rash on left forearm    Objective:   Physical Exam    Constitutional: He appears well-developed and well-nourished. No distress.  HENT:  Nose: Nose normal.       TMs normal Moderate pharyngeal injection and on palate. Slight erythema on left cheek and right side of tongue Sinus tenderness  Neck: Normal range of motion. Neck supple.  Pulmonary/Chest: Effort normal and breath sounds normal. No respiratory distress. He has no wheezes. He has no rales.  Lymphadenopathy:    He has no cervical adenopathy.  Skin:       Excoriated rash on left forearm--non specific          Assessment & Plan:

## 2012-01-13 ENCOUNTER — Telehealth: Payer: Self-pay

## 2012-01-13 NOTE — Telephone Encounter (Signed)
Pt called and S/T no better; pt said med he received was an antacid that pt was taking 1 tsp q4h prn; there was no directions on the bottle to swish and then spit out. I spoke with Vernona Rieger pharmacist at [facility]; she said no room on label for swish and then spit out and was written on bag in green sharpie. Vernona Rieger also said med ordered was antacid; need to order Dukes mouthwash which has lidocaine for pain.[facility].Please advise.

## 2012-01-13 NOTE — Telephone Encounter (Signed)
Medication phoned to pharmacy.  

## 2012-01-13 NOTE — Telephone Encounter (Signed)
Okay to send order for Duke's mouthwash 8 ounces x 1 5cc swish and spit prn for sore throat

## 2012-01-16 ENCOUNTER — Telehealth: Payer: Self-pay

## 2012-01-16 MED ORDER — HYDROCODONE-ACETAMINOPHEN 5-325 MG PO TABS: 1.0000 | ORAL_TABLET | ORAL | Status: DC | PRN

## 2012-01-16 MED ORDER — CLINDAMYCIN HCL 150 MG PO CAPS: 150.0000 mg | ORAL_CAPSULE | Freq: Three times a day (TID) | ORAL | Status: DC

## 2012-01-16 NOTE — Telephone Encounter (Signed)
Please call him Though it wasn't strep, with this length of time, should probably try and antibiotic Send Rx for clindamycin 150mg  tid     #30 x 0 Okay to send rx for norco 5/325  1 q 4 hours prn severe pain (#30 x 0)

## 2012-01-16 NOTE — Telephone Encounter (Signed)
S/T slightly better but still uncomfortable to swallow; med only gives temporary relief for S/T; pt has H/A which comes and goes; now pain level 4-5.;stomach feels lightly nauseated, no vomiting but no appetite for 2 -3 days. Pt using saline solution to irrigate nasal passages but not really helping.[facility].Please advise.

## 2012-01-16 NOTE — Telephone Encounter (Signed)
Spoke with patient and advised results rx sent to pharmacy by e-script rx called into pharmacy  

## 2012-02-09 ENCOUNTER — Telehealth: Payer: Self-pay

## 2012-02-09 NOTE — Telephone Encounter (Signed)
Will evaluate at the [facility] 

## 2012-02-09 NOTE — Telephone Encounter (Signed)
Pt seen in Sept for severe S/T; after antibiotics S/T ended about 1 week ago. Pt saw dentist 02/08/12; pt told throat was extremely red, and dentist thought pt had shingles in throat. Pt said since developed sorethroat food does not taste good and pt has lost 10-15 lbs. Pt request appt to see Dr Alphonsus Sias; offered appt today; pt could not come today scheduled appt 02/10/12 at 9 am. Pt will call back if symptoms change or worsen.

## 2012-02-10 ENCOUNTER — Ambulatory Visit (INDEPENDENT_AMBULATORY_CARE_PROVIDER_SITE_OTHER): Payer: Medicare Other | Admitting: Internal Medicine

## 2012-02-10 ENCOUNTER — Encounter: Payer: Self-pay | Admitting: Internal Medicine

## 2012-02-10 VITALS — BP 106/60 | HR 88 | Temp 98.3°F | Wt 217.0 lb

## 2012-02-10 DIAGNOSIS — J029 Acute pharyngitis, unspecified: Secondary | ICD-10-CM

## 2012-02-10 NOTE — Progress Notes (Signed)
Subjective:    Patient ID: [name], male    DOB: 15-Jan-1923, 78 y.o.   MRN: [id number]  HPI Throat finally stopped hurting a week ago or so At dentist yesterday--noted ongoing red throat Noted patch and then satellite lesions Had concern for "shingles in the throat"  Did have some gum involvement with peeling in past--this is better No fever now---intermittent low grade fever in the course of this  Took the clindamycin and the pain went on another few days or so---but now gone No swallowing problems now  Current Outpatient Prescriptions on File Prior to Visit  Medication Sig Dispense Refill  . allopurinol (ZYLOPRIM) 100 MG tablet Take 50 mg by mouth daily.      . Alum & Mag Hydroxide-Simeth (MAGIC MOUTHWASH) SOLN Take 5 mLs by mouth every 4 (four) hours as needed.  120 mL  2  . aspirin 81 MG tablet Take 81 mg by mouth daily.        . B Complex Vitamins (B COMPLEX 50) TABS Take 1 tablet by mouth daily.        . Cholecalciferol (VITAMIN D3) 1000 UNITS CAPS Take 1 capsule by mouth daily.        . clindamycin (CLEOCIN) 150 MG capsule Take 1 capsule (150 mg total) by mouth 3 (three) times daily.  30 capsule  0  . COLCRYS 0.6 MG tablet Take 0.6 mg by mouth daily as needed.       . cyanocobalamin (,VITAMIN B-12,) 1000 MCG/ML injection INJECT INTRAMUSCULARLY EVERY 2 WEEKS. DISCARD 28  DAYS AFTER FIRST USE.  30 mL  3  . donepezil (ARICEPT) 10 MG tablet Take 1 tablet (10 mg total) by mouth at bedtime.  90 tablet  3  . fish oil-omega-3 fatty acids 1000 MG capsule Take 1 g by mouth daily.        . folic acid (FOLVITE) 800 MCG tablet Take 1,600 mcg by mouth daily.       . methotrexate (RHEUMATREX) 2.5 MG tablet Take 15 mg by mouth once a week.       . Multiple Vitamin (MULTIVITAMIN) tablet Take 1 tablet by mouth daily.        Marland Kitchen oxybutynin (DITROPAN) 5 MG tablet Take 1 tablet (5 mg total) by mouth daily.  90 tablet  3  . propranolol (INDERAL LA) 60 MG 24 hr capsule Take 1 capsule (60 mg  total) by mouth daily.  90 capsule  3  . psyllium (METAMUCIL) 58.6 % powder Take 1 packet by mouth daily.        Marland Kitchen triamterene-hydrochlorothiazide (DYAZIDE) 37.5-25 MG per capsule Take 1 capsule by mouth daily.        Allergies  Allergen Reactions  . Cephalexin     REACTION: intestinal distress    Past Medical History  Diagnosis Date  . Mild cognitive impairment   . Neuropathy   . History of prostate cancer     lupron/casodex x6 months then RT  . BPH (benign prostatic hypertrophy)   . OSA (obstructive sleep apnea)     CPAP 13  . OA (osteoarthritis)   . SVT (supraventricular tachycardia)   . History of nephrolithiasis   . Diverticulosis of colon   . Urinary incontinence     med/briefs  . Bladder spasms     Past Surgical History  Procedure Date  . Tonsillectomy   . Transurethral resection of prostate 1990  . Svt ablation 1990  . Total knee arthroplasty ~2007  Left  . Eyelid reduction 2002/2006  . Cataract extraction     OU then laser Rx to both eyes    Family History  Problem Relation Age of Onset  . Cancer Mother     breast  . Nephritis Father   . Crohn's disease Brother   . Diabetes Son   . Cancer Sister   . Coronary artery disease Neg Hx   . Hypertension Neg Hx   . Colon cancer Neg Hx     History   Social History  . Marital Status: Widowed    Spouse Name: N/A    Number of Children: 2  . Years of Education: N/A   Occupational History  . retired     Social research officer, government; PhD in applied leadership/organizations   Social History Main Topics  . Smoking status: Former Smoker    Quit date: 05/02/1965  . Smokeless tobacco: Never Used  . Alcohol Use: Yes     occasional  . Drug Use: Not on file  . Sexually Active: Not on file   Other Topics Concern  . Not on file   Social History Narrative   Expects to make nephew Precious Gilding his health care POA--lives in [place]. Has living will. Requests DNR very strongly. Would not accept  tube feedings   Review of Systems Taste is only just returning Lost 10# over the course of this illness    Objective:   Physical Exam  Constitutional: He appears well-developed and well-nourished. No distress.  HENT:       Distinct redness across a small area of soft palate, mostly on right. Several vesicular lesions on right  Neck: Normal range of motion.  Lymphadenopathy:    He has no cervical adenopathy.          Assessment & Plan:

## 2012-02-10 NOTE — Assessment & Plan Note (Signed)
Appearance of localized vesicles does indicate possible herpetic infection--though I have never heard of zoster there ?outbreak of HSV-1 related to immune suppression on MTX?  If doesn't continue to improve, would use empiric antivirals (acyclovir or valacyclovir)

## 2012-04-11 ENCOUNTER — Ambulatory Visit (INDEPENDENT_AMBULATORY_CARE_PROVIDER_SITE_OTHER): Payer: Medicare Other | Admitting: Internal Medicine

## 2012-04-11 ENCOUNTER — Encounter: Payer: Self-pay | Admitting: Internal Medicine

## 2012-04-11 VITALS — BP 128/70 | HR 86 | Temp 98.6°F | Wt 219.0 lb

## 2012-04-11 DIAGNOSIS — Z23 Encounter for immunization: Secondary | ICD-10-CM

## 2012-04-11 DIAGNOSIS — N400 Enlarged prostate without lower urinary tract symptoms: Secondary | ICD-10-CM

## 2012-04-11 DIAGNOSIS — R42 Dizziness and giddiness: Secondary | ICD-10-CM

## 2012-04-11 DIAGNOSIS — E039 Hypothyroidism, unspecified: Secondary | ICD-10-CM

## 2012-04-11 DIAGNOSIS — G3184 Mild cognitive impairment, so stated: Secondary | ICD-10-CM

## 2012-04-11 DIAGNOSIS — Z1331 Encounter for screening for depression: Secondary | ICD-10-CM

## 2012-04-11 DIAGNOSIS — H8190 Unspecified disorder of vestibular function, unspecified ear: Secondary | ICD-10-CM

## 2012-04-11 DIAGNOSIS — K5730 Diverticulosis of large intestine without perforation or abscess without bleeding: Secondary | ICD-10-CM

## 2012-04-11 DIAGNOSIS — G43909 Migraine, unspecified, not intractable, without status migrainosus: Secondary | ICD-10-CM

## 2012-04-11 LAB — CBC WITH DIFFERENTIAL/PLATELET
Basophils Absolute: 0 10*3/uL (ref 0.0–0.1)
Eosinophils Absolute: 0.2 10*3/uL (ref 0.0–0.7)
Lymphocytes Relative: 16.8 % (ref 12.0–46.0)
MCHC: 33.8 g/dL (ref 30.0–36.0)
Neutrophils Relative %: 71 % (ref 43.0–77.0)
RBC: 4.11 Mil/uL — ABNORMAL LOW (ref 4.22–5.81)
RDW: 14.8 % — ABNORMAL HIGH (ref 11.5–14.6)

## 2012-04-11 LAB — BASIC METABOLIC PANEL
CO2: 30 mEq/L (ref 19–32)
Calcium: 9.4 mg/dL (ref 8.4–10.5)
Creatinine, Ser: 1 mg/dL (ref 0.4–1.5)
Glucose, Bld: 135 mg/dL — ABNORMAL HIGH (ref 70–99)

## 2012-04-11 LAB — HEPATIC FUNCTION PANEL
Bilirubin, Direct: 0.2 mg/dL (ref 0.0–0.3)
Total Bilirubin: 1.1 mg/dL (ref 0.3–1.2)

## 2012-04-11 LAB — T4, FREE: Free T4: 0.95 ng/dL (ref 0.60–1.60)

## 2012-04-11 NOTE — Assessment & Plan Note (Deleted)
No apparent decline in function or memory Remains on donepezil  

## 2012-04-11 NOTE — Assessment & Plan Note (Signed)
No apparent decline in function or memory Remains on donepezil

## 2012-04-11 NOTE — Assessment & Plan Note (Signed)
Voids okay though persistent symptoms from this No changes needed--on the oxybutynin

## 2012-04-11 NOTE — Assessment & Plan Note (Signed)
Lifelong Has had intermittent aura since he was 52 or so Propranolol has been effective as preventative Trigger is rapid weather changes Uses excedrin for aborting--he has premonitory feeling in head Then will use fioricet

## 2012-04-11 NOTE — Progress Notes (Signed)
Subjective:    Patient ID: [name], male    DOB: Sep 09, 1922, 78 y.o.   MRN: [id number]  HPI Still having some balance issues No recurrence of the deafness Off methotrexate for about 2 weeks Chronic tinnitus---high pitched (stable) MTX caused the gout  Otherwise feels well Still not sleeping normally since the whole deafness episode Now sleeping 10 hours a night instead of 6. Still on CPAP Does have good energy levels  No change in cognition Able to continue competitive bridge Does bills, never gets lost in car, etc Occ vivid dreams with the donepezil  Voids okay Still has weak stream Wears protection for mild leakage Nocturia every 2-3 hours---able to get back to sleep though  Current Outpatient Prescriptions on File Prior to Visit  Medication Sig Dispense Refill  . allopurinol (ZYLOPRIM) 100 MG tablet Take 50 mg by mouth daily.      Marland Kitchen aspirin 81 MG tablet Take 81 mg by mouth daily.        . B Complex Vitamins (B COMPLEX 50) TABS Take 1 tablet by mouth daily.        . Cholecalciferol (VITAMIN D3) 1000 UNITS CAPS Take 1 capsule by mouth daily.        Marland Kitchen COLCRYS 0.6 MG tablet Take 0.6 mg by mouth daily as needed.       . cyanocobalamin (,VITAMIN B-12,) 1000 MCG/ML injection INJECT INTRAMUSCULARLY EVERY 2 WEEKS. DISCARD 28  DAYS AFTER FIRST USE.  30 mL  3  . donepezil (ARICEPT) 10 MG tablet Take 1 tablet (10 mg total) by mouth at bedtime.  90 tablet  3  . fish oil-omega-3 fatty acids 1000 MG capsule Take 1 g by mouth daily.        . folic acid (FOLVITE) 800 MCG tablet Take 1,600 mcg by mouth daily.       . Multiple Vitamin (MULTIVITAMIN) tablet Take 1 tablet by mouth daily.        Marland Kitchen oxybutynin (DITROPAN) 5 MG tablet Take 1 tablet (5 mg total) by mouth daily.  90 tablet  3  . propranolol (INDERAL LA) 60 MG 24 hr capsule Take 1 capsule (60 mg total) by mouth daily.  90 capsule  3  . psyllium (METAMUCIL) 58.6 % powder Take 1 packet by mouth daily.        Marland Kitchen  triamterene-hydrochlorothiazide (DYAZIDE) 37.5-25 MG per capsule Take 1 capsule by mouth daily.        Allergies  Allergen Reactions  . Cephalexin     REACTION: intestinal distress    Past Medical History  Diagnosis Date  . Mild cognitive impairment   . Neuropathy   . History of prostate cancer     lupron/casodex x6 months then RT  . BPH (benign prostatic hypertrophy)   . OSA (obstructive sleep apnea)     CPAP 13  . OA (osteoarthritis)   . SVT (supraventricular tachycardia)   . History of nephrolithiasis   . Diverticulosis of colon   . Urinary incontinence     med/briefs  . Bladder spasms     Past Surgical History  Procedure Date  . Tonsillectomy   . Transurethral resection of prostate 1990  . Svt ablation 1990  . Total knee arthroplasty ~2007    Left  . Eyelid reduction 2002/2006  . Cataract extraction     OU then laser Rx to both eyes    Family History  Problem Relation Age of Onset  . Cancer Mother  breast  . Nephritis Father   . Crohn's disease Brother   . Diabetes Son   . Cancer Sister   . Coronary artery disease Neg Hx   . Hypertension Neg Hx   . Colon cancer Neg Hx     History   Social History  . Marital Status: Widowed    Spouse Name: N/A    Number of Children: 2  . Years of Education: N/A   Occupational History  . retired     Social research officer, government; PhD in applied leadership/organizations   Social History Main Topics  . Smoking status: Former Smoker    Quit date: 05/02/1965  . Smokeless tobacco: Never Used  . Alcohol Use: Yes     Comment: occasional  . Drug Use: Not on file  . Sexually Active: Not on file   Other Topics Concern  . Not on file   Social History Narrative   Expects to make nephew Precious Gilding his health care POA--lives in [place]. Has living will. Requests DNR very strongly. Would not accept tube feedings   Review of Systems Appetite is good  Weight is stable Gets B12 shots every other week still  (from [facility]) Bowels are okay--takes probiotic which really help him    Objective:   Physical Exam  Constitutional: He appears well-developed and well-nourished. No distress.  Neck: Normal range of motion. Neck supple.  Cardiovascular: Normal rate, regular rhythm, normal heart sounds and intact distal pulses.  Exam reveals no gallop.   No murmur heard. Pulmonary/Chest: Effort normal and breath sounds normal. No respiratory distress. He has no wheezes. He has no rales.  Abdominal: Soft. There is no tenderness.  Musculoskeletal: He exhibits no edema.  Lymphadenopathy:    He has no cervical adenopathy.  Psychiatric: He has a normal mood and affect. His behavior is normal.          Assessment & Plan:

## 2012-04-11 NOTE — Assessment & Plan Note (Signed)
Off the methotrexate now Will need to restart if hearing worsens again

## 2012-04-12 ENCOUNTER — Encounter: Payer: Self-pay | Admitting: *Deleted

## 2012-04-27 ENCOUNTER — Encounter: Payer: Self-pay | Admitting: Internal Medicine

## 2012-07-23 ENCOUNTER — Telehealth: Payer: Self-pay

## 2012-07-23 NOTE — Telephone Encounter (Signed)
Pt left v/m has been getting Vit B 12 injectable at [facility] and pt was told on national back order; may not be available for 4 months. Pt will get Vit B 12 injection today and then will be out of med; pt gets injections at [facility].Please advise.Pt request call back.

## 2012-07-23 NOTE — Telephone Encounter (Signed)
Please call him If he has been getting these regularly, a 4 month lapse will probably not cause a serious lack of B12 in his system. He can try oral supplements (1000 mcg) daily. While he may not absorb well, he might get some. I don't have any control over the shortage

## 2012-07-23 NOTE — Telephone Encounter (Signed)
Spoke with patient and advised results   

## 2012-08-01 NOTE — Telephone Encounter (Signed)
Received 2 faxes asking for a substitute for b-12 injection and I advised the pharmacy that pt will be taking the OTC b-12 until the shortage is over.

## 2012-08-10 ENCOUNTER — Telehealth: Payer: Self-pay | Admitting: Family Medicine

## 2012-08-10 NOTE — Telephone Encounter (Signed)
Spoke with patient and advised results   

## 2012-08-10 NOTE — Telephone Encounter (Signed)
I would probably just recommend a local supplier like [facility] up the road He could probably use anyone near him for convenience

## 2012-08-10 NOTE — Telephone Encounter (Signed)
Pt left vm stating that the supplier in [place] that has been providing him with CPAP supplies will no longer be able to do so.  He is requesting a suggestion for another supplier.  He requests that information be left in a message if he is unable to answer the phone.

## 2012-08-13 NOTE — Telephone Encounter (Signed)
Order faxed back.

## 2012-08-13 NOTE — Telephone Encounter (Signed)
Rx written. Please fax.

## 2012-08-13 NOTE — Telephone Encounter (Signed)
Pt request order to [facility] for C pap supplies. Pt is going to [facility] this AM and give them list of supplies needed.Please advise.

## 2012-09-03 ENCOUNTER — Ambulatory Visit (INDEPENDENT_AMBULATORY_CARE_PROVIDER_SITE_OTHER): Payer: Medicare Other | Admitting: Internal Medicine

## 2012-09-03 ENCOUNTER — Encounter: Payer: Self-pay | Admitting: Internal Medicine

## 2012-09-03 VITALS — BP 140/80 | HR 56 | Temp 98.7°F | Wt 231.0 lb

## 2012-09-03 DIAGNOSIS — N3090 Cystitis, unspecified without hematuria: Secondary | ICD-10-CM

## 2012-09-03 DIAGNOSIS — R350 Frequency of micturition: Secondary | ICD-10-CM

## 2012-09-03 LAB — POCT URINALYSIS DIPSTICK
Bilirubin, UA: NEGATIVE
Glucose, UA: NEGATIVE
Ketones, UA: NEGATIVE
Leukocytes, UA: NEGATIVE
Nitrite, UA: NEGATIVE
Protein, UA: NEGATIVE
Spec Grav, UA: 1.015
Urobilinogen, UA: NEGATIVE
pH, UA: 6

## 2012-09-03 NOTE — Patient Instructions (Signed)
Please try off the oxybutynin. Let me know if your bladder symptoms persist---we will consider another med or referral to a urologist.

## 2012-09-03 NOTE — Progress Notes (Signed)
Subjective:    Patient ID: [name], male    DOB: 12-Jul-1922, 79 y.o.   MRN: [id number]  HPI Friday late afternoon started with urinary urgency and frequency Small volume voids Felt uncomfortable or almost painful Went through the night but symptoms persisted  Went to urgent care (fast med) Urine sample done but was clear but he was prescribed bactrim for 3 days No culture sent Feels better this AM  No fever Has had brief feelings of warmth but no sweats or chills Still on the oxybutynin for urge incontinence (leakage)  Current Outpatient Prescriptions on File Prior to Visit  Medication Sig Dispense Refill  . aspirin 81 MG tablet Take 81 mg by mouth daily.        . B Complex Vitamins (B COMPLEX 50) TABS Take 1 tablet by mouth daily.        . Cholecalciferol (VITAMIN D3) 1000 UNITS CAPS Take 1 capsule by mouth daily.        Marland Kitchen COLCRYS 0.6 MG tablet Take 0.6 mg by mouth daily as needed.       . cyanocobalamin (,VITAMIN B-12,) 1000 MCG/ML injection INJECT INTRAMUSCULARLY EVERY 2 WEEKS. DISCARD 28  DAYS AFTER FIRST USE.  30 mL  3  . donepezil (ARICEPT) 10 MG tablet Take 1 tablet (10 mg total) by mouth at bedtime.  90 tablet  3  . fish oil-omega-3 fatty acids 1000 MG capsule Take 1 g by mouth daily.        . folic acid (FOLVITE) 800 MCG tablet Take 1,600 mcg by mouth daily.       . Multiple Vitamin (MULTIVITAMIN) tablet Take 1 tablet by mouth daily.        Marland Kitchen oxybutynin (DITROPAN) 5 MG tablet Take 1 tablet (5 mg total) by mouth daily.  90 tablet  3  . propranolol (INDERAL LA) 60 MG 24 hr capsule Take 1 capsule (60 mg total) by mouth daily.  90 capsule  3  . psyllium (METAMUCIL) 58.6 % powder Take 1 packet by mouth daily.        Marland Kitchen triamterene-hydrochlorothiazide (DYAZIDE) 37.5-25 MG per capsule Take 1 capsule by mouth daily.       No current facility-administered medications on file prior to visit.    Allergies  Allergen Reactions  . Cephalexin     REACTION: intestinal  distress    Past Medical History  Diagnosis Date  . Mild cognitive impairment   . Neuropathy   . History of prostate cancer     lupron/casodex x6 months then RT  . BPH (benign prostatic hypertrophy)   . OSA (obstructive sleep apnea)     CPAP 13  . OA (osteoarthritis)   . SVT (supraventricular tachycardia)   . History of nephrolithiasis   . Diverticulosis of colon   . Urinary incontinence     med/briefs  . Bladder spasms     Past Surgical History  Procedure Laterality Date  . Tonsillectomy    . Transurethral resection of prostate  1990  . Svt ablation  1990  . Total knee arthroplasty  ~2007    Left  . Eyelid reduction  2002/2006  . Cataract extraction      OU then laser Rx to both eyes    Family History  Problem Relation Age of Onset  . Cancer Mother     breast  . Nephritis Father   . Crohn's disease Brother   . Diabetes Son   . Cancer Sister   .  Coronary artery disease Neg Hx   . Hypertension Neg Hx   . Colon cancer Neg Hx     History   Social History  . Marital Status: Widowed    Spouse Name: N/A    Number of Children: 2  . Years of Education: N/A   Occupational History  . retired     Social research officer, government; PhD in applied leadership/organizations   Social History Main Topics  . Smoking status: Former Smoker    Quit date: 05/02/1965  . Smokeless tobacco: Never Used  . Alcohol Use: Yes     Comment: occasional  . Drug Use: Not on file  . Sexually Active: Not on file   Other Topics Concern  . Not on file   Social History Narrative   Expects to make nephew Precious Gilding his health care POA--lives in [place]. Has living will. Requests DNR very strongly. Would not accept tube feedings         Review of Systems Appetite is fine Some chronic low back pain--no CVA pain    Objective:   Physical Exam  Constitutional: He appears well-developed and well-nourished. No distress.  Abdominal: Soft. He exhibits no distension. There is no  tenderness. There is no rebound and no guarding.  Bladder is percussed at slightly under the umbilicus          Assessment & Plan:

## 2012-09-03 NOTE — Assessment & Plan Note (Signed)
Presumed diagnosis  Symptoms some better with the antibiotic though the urinalysis apparently was clear He seems to have retention Not sure if there is any retained prostatic tissue after TURP in ~1992 and then RT for cancer 1999  Will try off the oxybutynin Consider trial of flomax or set up follow up with urology

## 2012-09-05 ENCOUNTER — Encounter: Payer: Self-pay | Admitting: Internal Medicine

## 2012-09-05 MED ORDER — FOLIC ACID 800 MCG PO TABS: 800.0000 ug | ORAL_TABLET | Freq: Every day | ORAL | Status: DC

## 2012-09-05 MED ORDER — VITAMIN B-12 1000 MCG PO TABS: 1000.0000 ug | ORAL_TABLET | Freq: Every day | ORAL | Status: DC

## 2012-09-07 ENCOUNTER — Encounter: Payer: Self-pay | Admitting: Internal Medicine

## 2012-09-14 ENCOUNTER — Encounter: Payer: Self-pay | Admitting: Internal Medicine

## 2012-09-15 ENCOUNTER — Encounter: Payer: Self-pay | Admitting: Internal Medicine

## 2012-09-23 ENCOUNTER — Encounter: Payer: Self-pay | Admitting: Internal Medicine

## 2012-09-25 ENCOUNTER — Encounter: Payer: Self-pay | Admitting: Internal Medicine

## 2012-09-25 MED ORDER — PROPRANOLOL HCL ER 60 MG PO CP24: 60.0000 mg | ORAL_CAPSULE | Freq: Every day | ORAL | Status: DC

## 2012-10-10 ENCOUNTER — Ambulatory Visit (INDEPENDENT_AMBULATORY_CARE_PROVIDER_SITE_OTHER): Payer: Medicare Other | Admitting: Internal Medicine

## 2012-10-10 ENCOUNTER — Encounter: Payer: Self-pay | Admitting: Internal Medicine

## 2012-10-10 VITALS — BP 120/60 | HR 60 | Temp 98.7°F | Wt 233.0 lb

## 2012-10-10 DIAGNOSIS — L919 Hypertrophic disorder of the skin, unspecified: Secondary | ICD-10-CM

## 2012-10-10 DIAGNOSIS — G3184 Mild cognitive impairment, so stated: Secondary | ICD-10-CM

## 2012-10-10 DIAGNOSIS — L918 Other hypertrophic disorders of the skin: Secondary | ICD-10-CM | POA: Insufficient documentation

## 2012-10-10 DIAGNOSIS — N3090 Cystitis, unspecified without hematuria: Secondary | ICD-10-CM

## 2012-10-10 DIAGNOSIS — G43909 Migraine, unspecified, not intractable, without status migrainosus: Secondary | ICD-10-CM

## 2012-10-10 NOTE — Assessment & Plan Note (Signed)
Stable No decline on the donepezil

## 2012-10-10 NOTE — Progress Notes (Signed)
Subjective:    Patient ID: [name], male    DOB: 1923/02/17, 79 y.o.   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 aspirin 81 MG tablet Take 81 mg by mouth daily.        Marland Kitchen donepezil (ARICEPT) 10 MG tablet Take 1 tablet (10 mg total) by mouth at bedtime.  90 tablet  3  . fish oil-omega-3 fatty acids 1000 MG capsule Take 1 g by mouth daily.        . folic acid (FOLVITE) 800 MCG tablet Take 1 tablet (800 mcg total) by mouth daily.      . Multiple Vitamin (MULTIVITAMIN) tablet Take 1 tablet by mouth daily.        . propranolol ER (INDERAL LA) 60 MG 24 hr capsule Take 1 capsule (60 mg total) by mouth daily.  90 capsule  3  . psyllium (METAMUCIL) 58.6 % powder Take 1 packet by mouth daily.        . vitamin B-12 (CYANOCOBALAMIN) 1000 MCG tablet Take 1 tablet (1,000 mcg total) by mouth daily.       No current facility-administered medications on file prior to visit.    Allergies  Allergen Reactions  . Cephalexin     REACTION: intestinal distress    Past Medical History  Diagnosis Date  . Mild cognitive impairment   . Neuropathy   . History of prostate cancer     lupron/casodex x6 months then RT  . BPH (benign prostatic hypertrophy)   . OSA (obstructive sleep apnea)     CPAP 13  . OA (osteoarthritis)    . SVT (supraventricular tachycardia)   . History of nephrolithiasis   . Diverticulosis of colon   . Urinary incontinence     med/briefs  . Bladder spasms     Past Surgical History  Procedure Laterality Date  . Tonsillectomy    . Transurethral resection of prostate  1990  . Svt ablation  1990  . Total knee arthroplasty  ~2007    Left  . Eyelid reduction  2002/2006  . Cataract extraction      OU then laser Rx to both eyes    Family History  Problem Relation Age of Onset  . Cancer Mother     breast  . Nephritis Father   . Crohn's disease Brother   . Diabetes Son   . Cancer Sister   . Coronary artery disease Neg Hx   . Hypertension Neg Hx   . Colon cancer Neg Hx     History   Social History  . Marital Status: Widowed    Spouse Name: N/A    Number of Children: 2  . Years of Education: N/A   Occupational History  . retired     Social research officer, government; PhD in applied leadership/organizations   Social History Main Topics  . Smoking  status: Former Smoker    Quit date: 05/02/1965  . Smokeless tobacco: Never Used  . Alcohol Use: Yes     Comment: occasional  . Drug Use: Not on file  . Sexually Active: Not on file   Other Topics Concern  . Not on file   Social History Narrative   Expects to make nephew Precious Gilding his health care POA--lives in [place]. Has living will. Requests DNR very strongly. Would not accept tube feedings         Review of Systems Discussed zostavax--he is not excited about getting this Reviewed his colonoscopy status--mychart suggested he needs this but I told him not at his age    Objective:   Physical Exam  Constitutional: He appears well-developed. No distress.  Neck: Normal range of motion. Neck supple. No thyromegaly present.  Cardiovascular: Normal rate and regular rhythm.  Exam reveals no gallop.   Murmur heard. Soft end systolic murmur in mitral area  Pulmonary/Chest: Effort normal and breath sounds normal. No  respiratory distress. He has no wheezes. He has no rales.  Musculoskeletal: He exhibits no edema and no tenderness.  Lymphadenopathy:    He has no cervical adenopathy.  Skin:  Irritated skin tags--one on each side of neck  Psychiatric: He has a normal mood and affect. His behavior is normal.          Assessment & Plan:

## 2012-10-10 NOTE — Assessment & Plan Note (Signed)
Has bladder irritability Doing better now Continue just the oxybutynin

## 2012-10-10 NOTE — Assessment & Plan Note (Signed)
Propranolol still helps When excedrin/tylenol don't abort---will rarely use fioricet, cyclobenzaprine or orphenadrine

## 2012-10-10 NOTE — Assessment & Plan Note (Signed)
Irritated and itchy  Liquid nitrogen 45 seconds x 2 to each Tolerated well Discussed home care

## 2012-10-15 ENCOUNTER — Other Ambulatory Visit: Payer: Self-pay | Admitting: Internal Medicine

## 2012-10-18 ENCOUNTER — Encounter: Payer: Self-pay | Admitting: Internal Medicine

## 2012-10-19 ENCOUNTER — Ambulatory Visit (INDEPENDENT_AMBULATORY_CARE_PROVIDER_SITE_OTHER): Payer: Medicare Other | Admitting: Family Medicine

## 2012-10-19 ENCOUNTER — Encounter: Payer: Self-pay | Admitting: Family Medicine

## 2012-10-19 VITALS — BP 122/70 | HR 64 | Temp 97.7°F | Wt 236.0 lb

## 2012-10-19 DIAGNOSIS — L0390 Cellulitis, unspecified: Secondary | ICD-10-CM

## 2012-10-19 DIAGNOSIS — L989 Disorder of the skin and subcutaneous tissue, unspecified: Secondary | ICD-10-CM

## 2012-10-19 DIAGNOSIS — L0291 Cutaneous abscess, unspecified: Secondary | ICD-10-CM | POA: Insufficient documentation

## 2012-10-19 DIAGNOSIS — R238 Other skin changes: Secondary | ICD-10-CM

## 2012-10-19 MED ORDER — SULFAMETHOXAZOLE-TMP DS 800-160 MG PO TABS: 1.0000 | ORAL_TABLET | Freq: Two times a day (BID) | ORAL | Status: DC

## 2012-10-19 NOTE — Patient Instructions (Addendum)
It was wonderful to meet you. Please take Bactrim (the antibiotic) as directed- 1 tablet as directed x 7 days. Ok to keep area uncovered.  If you develop any fever or worsening redness or pain, please call us this weekend or go to an urgent care.  Please call us next week with an update.

## 2012-10-19 NOTE — Progress Notes (Signed)
Subjective:    Patient ID: [name], male    DOB: February 23, 1923, 79 y.o.   MRN: [id number]  Pleasant 79 yo male pt of Dr. Alphonsus Sias here for irritated area on left neck where he had cryotherapy on 6/11. Dr. Karle Starch note from 6/11 reviewed- cryotherapy applied to irritated skin tags on neck.  He has had no fevers, drainage, erythema or discharge.  It has been growing in size, a little painful.  Right neck skin tag healing well.  PMH significant for Cephalexin allergy.  He has been applying topical abx ointment to the area    Current Outpatient Prescriptions on File Prior to Visit  Medication Sig Dispense Refill  . aspirin 81 MG tablet Take 81 mg by mouth daily.        Marland Kitchen donepezil (ARICEPT) 10 MG tablet Take 1 tablet (10 mg total) by mouth at bedtime.  90 tablet  3  . fish oil-omega-3 fatty acids 1000 MG capsule Take 1 g by mouth daily.        . folic acid (FOLVITE) 800 MCG tablet Take 1 tablet (800 mcg total) by mouth daily.      . Multiple Vitamin (MULTIVITAMIN) tablet Take 1 tablet by mouth daily.        Marland Kitchen oxybutynin (DITROPAN) 5 MG tablet Take 5 mg by mouth daily.      Marland Kitchen oxybutynin (DITROPAN) 5 MG tablet TAKE 1 TABLET (5 MG TOTAL) BY MOUTH DAILY.  90 tablet  3  . propranolol ER (INDERAL LA) 60 MG 24 hr capsule Take 1 capsule (60 mg total) by mouth daily.  90 capsule  3  . psyllium (METAMUCIL) 58.6 % powder Take 1 packet by mouth daily.        . vitamin B-12 (CYANOCOBALAMIN) 1000 MCG tablet Take 1 tablet (1,000 mcg total) by mouth daily.       No current facility-administered medications on file prior to visit.    Allergies  Allergen Reactions  . Cephalexin     REACTION: intestinal distress    Past Medical History  Diagnosis Date  . Mild cognitive impairment   . Neuropathy   . History of prostate cancer     lupron/casodex x6 months then RT  . BPH (benign prostatic hypertrophy)   . OSA (obstructive sleep apnea)     CPAP 13  . OA (osteoarthritis)   . SVT  (supraventricular tachycardia)   . History of nephrolithiasis   . Diverticulosis of colon   . Urinary incontinence     med/briefs  . Bladder spasms     Past Surgical History  Procedure Laterality Date  . Tonsillectomy    . Transurethral resection of prostate  1990  . Svt ablation  1990  . Total knee arthroplasty  ~2007    Left  . Eyelid reduction  2002/2006  . Cataract extraction      OU then laser Rx to both eyes    Family History  Problem Relation Age of Onset  . Cancer Mother     breast  . Nephritis Father   . Crohn's disease Brother   . Diabetes Son   . Cancer Sister   . Coronary artery disease Neg Hx   . Hypertension Neg Hx   . Colon cancer Neg Hx     History   Social History  . Marital Status: Widowed    Spouse Name: N/A    Number of Children: 2  . Years of Education: N/A   Occupational History  .  retired     Social research officer, government; PhD in applied leadership/organizations   Social History Main Topics  . Smoking status: Former Smoker    Quit date: 05/02/1965  . Smokeless tobacco: Never Used  . Alcohol Use: Yes     Comment: occasional  . Drug Use: Not on file  . Sexually Active: Not on file   Other Topics Concern  . Not on file   Social History Narrative   Expects to make nephew Precious Gilding his health care POA--lives in [place]. Has living will. Requests DNR very strongly. Would not accept tube feedings         Review of Systems See HPI    Objective:   Physical Exam  BP 122/70  Pulse 64  Temp(Src) 97.7 F (36.5 C)  Wt 236 lb (107.049 kg)  BMI 36.95 kg/m2  Constitutional: He appears well-developed. No distress.  Pulmonary/Chest: Effort normal and breath sounds normal. No respiratory distress. He has no wheezes. He has no rales.  Musculoskeletal: He exhibits no edema and no tenderness.  Lymphadenopathy:    He has no cervical adenopathy.  Skin:  1.5 cm erythematous nonfluctuant abscess on left neck, mild erythema without  drainage Psychiatric: He has a normal mood and affect. His behavior is normal.       Assessment & Plan:  1. Cellulitis/abscess New- non fluctuant. Will start on Bactrim DS- 1 tab twice daily x 7 days. Advised to keep a close eye on it.  May need I and D but feels quit superficial so may not. Red flag symptoms discussed. Call or return to clinic prn if these symptoms worsen or fail to improve as anticipated. The patient indicates understanding of these issues and agrees with the plan.

## 2012-10-22 ENCOUNTER — Encounter: Payer: Self-pay | Admitting: Internal Medicine

## 2012-10-22 NOTE — Telephone Encounter (Signed)
Dr Alphonsus Sias

## 2012-10-26 ENCOUNTER — Encounter: Payer: Self-pay | Admitting: Internal Medicine

## 2012-10-29 ENCOUNTER — Encounter: Payer: Self-pay | Admitting: Internal Medicine

## 2012-10-29 ENCOUNTER — Ambulatory Visit (INDEPENDENT_AMBULATORY_CARE_PROVIDER_SITE_OTHER): Payer: Medicare Other | Admitting: Internal Medicine

## 2012-10-29 VITALS — BP 128/64 | HR 88 | Temp 98.6°F | Wt 238.0 lb

## 2012-10-29 DIAGNOSIS — R32 Unspecified urinary incontinence: Secondary | ICD-10-CM

## 2012-10-29 DIAGNOSIS — R109 Unspecified abdominal pain: Secondary | ICD-10-CM

## 2012-10-29 LAB — POCT URINALYSIS DIPSTICK
Bilirubin, UA: NEGATIVE
Blood, UA: NEGATIVE
Glucose, UA: NEGATIVE
Ketones, UA: NEGATIVE
Leukocytes, UA: NEGATIVE
Nitrite, UA: NEGATIVE
pH, UA: 6

## 2012-10-29 NOTE — Progress Notes (Signed)
Subjective:    Patient ID: [name], male    DOB: 11/15/1922, 80 y.o.   MRN: [id number]  HPI Things really got worse with his urinary incontinence this weekend No control at all Still on the oxybutynin Was on bactrim for cellulitis---just finished this No dysuria or hematuria  No fever  Having some abdominal pain over the past 4 days Low abdominal cramping---aleve and ibuprofen some help Cramping is better but has soreness in suprapubic area and along right hip (actually sore with deep breath)  No nausea or vomiting  Appetite is okay  Current Outpatient Prescriptions on File Prior to Visit  Medication Sig Dispense Refill  . aspirin 81 MG tablet Take 81 mg by mouth daily.        Marland Kitchen donepezil (ARICEPT) 10 MG tablet Take 1 tablet (10 mg total) by mouth at bedtime.  90 tablet  3  . fish oil-omega-3 fatty acids 1000 MG capsule Take 1 g by mouth daily.        . folic acid (FOLVITE) 800 MCG tablet Take 1 tablet (800 mcg total) by mouth daily.      . Multiple Vitamin (MULTIVITAMIN) tablet Take 1 tablet by mouth daily.        Marland Kitchen oxybutynin (DITROPAN) 5 MG tablet TAKE 1 TABLET (5 MG TOTAL) BY MOUTH DAILY.  90 tablet  3  . propranolol ER (INDERAL LA) 60 MG 24 hr capsule Take 1 capsule (60 mg total) by mouth daily.  90 capsule  3  . psyllium (METAMUCIL) 58.6 % powder Take 1 packet by mouth daily.        . vitamin B-12 (CYANOCOBALAMIN) 1000 MCG tablet Take 1 tablet (1,000 mcg total) by mouth daily.       No current facility-administered medications on file prior to visit.    Allergies  Allergen Reactions  . Cephalexin     REACTION: intestinal distress    Past Medical History  Diagnosis Date  . Mild cognitive impairment   . Neuropathy   . History of prostate cancer     lupron/casodex x6 months then RT  . BPH (benign prostatic hypertrophy)   . OSA (obstructive sleep apnea)     CPAP 13  . OA (osteoarthritis)   . SVT (supraventricular tachycardia)   . History of nephrolithiasis    . Diverticulosis of colon   . Urinary incontinence     med/briefs  . Bladder spasms     Past Surgical History  Procedure Laterality Date  . Tonsillectomy    . Transurethral resection of prostate  1990  . Svt ablation  1990  . Total knee arthroplasty  ~2007    Left  . Eyelid reduction  2002/2006  . Cataract extraction      OU then laser Rx to both eyes    Family History  Problem Relation Age of Onset  . Cancer Mother     breast  . Nephritis Father   . Crohn's disease Brother   . Diabetes Son   . Cancer Sister   . Coronary artery disease Neg Hx   . Hypertension Neg Hx   . Colon cancer Neg Hx     History   Social History  . Marital Status: Widowed    Spouse Name: N/A    Number of Children: 2  . Years of Education: N/A   Occupational History  . retired     Social research officer, government; PhD in applied leadership/organizations   Social History Main Topics  .  Smoking status: Former Smoker    Quit date: 05/02/1965  . Smokeless tobacco: Never Used  . Alcohol Use: Yes     Comment: occasional  . Drug Use: No  . Sexually Active: Not on file   Other Topics Concern  . Not on file   Social History Narrative   Expects to make nephew Precious Gilding his health care POA--lives in [place]. Has living will. Requests DNR very strongly. Would not accept tube feedings         Review of Systems Bowels have been okay--maybe more frequent (so has restricted his diet) No loose stools though--just soft    Objective:   Physical Exam  Constitutional: He appears well-developed and well-nourished. No distress.  Abdominal: Soft.  Seems to have a firm mass in suprapubic region Dribbling urine   Genitourinary:  No scrotal findings          Assessment & Plan:

## 2012-10-29 NOTE — Assessment & Plan Note (Signed)
Urinalysis is normal so no infection Sudden worsening of incontinence--- ?bladder distension (not clear cut) Shouldn't have enlarged prostate after RT but is possible  Will make referral to urology He requests Dr Hayden Pedro at [facility] If he can't get in there within 7-10 days, would try [facility] If unable to void, will need cath by RN at [facility]

## 2012-10-30 ENCOUNTER — Encounter: Payer: Self-pay | Admitting: Internal Medicine

## 2012-10-30 ENCOUNTER — Other Ambulatory Visit: Payer: Self-pay | Admitting: Internal Medicine

## 2012-10-30 MED ORDER — VITAMIN B-12 5000 MCG SL SUBL: 1.0000 | SUBLINGUAL_TABLET | Freq: Every day | SUBLINGUAL | Status: DC

## 2012-10-30 NOTE — Telephone Encounter (Signed)
Appt was  Made with Dr Assunta Gambles at the [facility] Urology office in [facility] for Monday July 7th, patient is aware of this appointment and that he can followup with Dr Achilles Dunk in the [facility].

## 2012-11-01 ENCOUNTER — Encounter: Payer: Self-pay | Admitting: Internal Medicine

## 2012-11-01 ENCOUNTER — Inpatient Hospital Stay: Payer: Self-pay | Admitting: Family Medicine

## 2012-11-01 LAB — BASIC METABOLIC PANEL
Anion Gap: 6 — ABNORMAL LOW (ref 7–16)
BUN: 54 mg/dL — ABNORMAL HIGH (ref 7–18)
Calcium, Total: 9.5 mg/dL (ref 8.5–10.1)
Chloride: 113 mmol/L — ABNORMAL HIGH (ref 98–107)
Creatinine: 4.14 mg/dL — ABNORMAL HIGH (ref 0.60–1.30)
EGFR (African American): 14 — ABNORMAL LOW
EGFR (Non-African Amer.): 12 — ABNORMAL LOW
Glucose: 92 mg/dL (ref 65–99)
Potassium: 6 mmol/L — ABNORMAL HIGH (ref 3.5–5.1)
Sodium: 143 mmol/L (ref 136–145)

## 2012-11-01 LAB — CBC
HCT: 33.2 % — ABNORMAL LOW (ref 40.0–52.0)
MCH: 31.7 pg (ref 26.0–34.0)
MCHC: 33.9 g/dL (ref 32.0–36.0)
MCV: 94 fL (ref 80–100)
Platelet: 164 10*3/uL (ref 150–440)
RBC: 3.55 10*6/uL — ABNORMAL LOW (ref 4.40–5.90)
RDW: 14.1 % (ref 11.5–14.5)

## 2012-11-01 LAB — URINALYSIS, COMPLETE
Ketone: NEGATIVE
Leukocyte Esterase: NEGATIVE
Nitrite: NEGATIVE
Ph: 5 (ref 4.5–8.0)
RBC,UR: 3 /HPF (ref 0–5)
WBC UR: 2 /HPF (ref 0–5)

## 2012-11-02 LAB — COMPREHENSIVE METABOLIC PANEL
Alkaline Phosphatase: 54 U/L (ref 50–136)
BUN: 37 mg/dL — ABNORMAL HIGH (ref 7–18)
Chloride: 113 mmol/L — ABNORMAL HIGH (ref 98–107)
Creatinine: 2.22 mg/dL — ABNORMAL HIGH (ref 0.60–1.30)
EGFR (African American): 30 — ABNORMAL LOW
EGFR (Non-African Amer.): 26 — ABNORMAL LOW
Osmolality: 295 (ref 275–301)
Potassium: 4.4 mmol/L (ref 3.5–5.1)
SGOT(AST): 15 U/L (ref 15–37)
SGPT (ALT): 17 U/L (ref 12–78)
Total Protein: 6.1 g/dL — ABNORMAL LOW (ref 6.4–8.2)

## 2012-11-02 LAB — CBC WITH DIFFERENTIAL/PLATELET
Basophil #: 0 10*3/uL (ref 0.0–0.1)
Basophil %: 0.6 %
Eosinophil %: 3 %
HCT: 27.4 % — ABNORMAL LOW (ref 40.0–52.0)
Lymphocyte %: 14.1 %
MCH: 33.8 pg (ref 26.0–34.0)
MCHC: 36.1 g/dL — ABNORMAL HIGH (ref 32.0–36.0)
MCV: 94 fL (ref 80–100)
Monocyte %: 11.2 %
Neutrophil #: 4.5 10*3/uL (ref 1.4–6.5)
Neutrophil %: 71.1 %
Platelet: 143 10*3/uL — ABNORMAL LOW (ref 150–440)
RBC: 2.92 10*6/uL — ABNORMAL LOW (ref 4.40–5.90)
RDW: 13.7 % (ref 11.5–14.5)
WBC: 6.3 10*3/uL (ref 3.8–10.6)

## 2012-11-03 LAB — BASIC METABOLIC PANEL
BUN: 23 mg/dL — ABNORMAL HIGH (ref 7–18)
Calcium, Total: 9 mg/dL (ref 8.5–10.1)
Chloride: 113 mmol/L — ABNORMAL HIGH (ref 98–107)
EGFR (African American): 51 — ABNORMAL LOW
Glucose: 91 mg/dL (ref 65–99)
Potassium: 4.3 mmol/L (ref 3.5–5.1)

## 2012-11-04 LAB — BASIC METABOLIC PANEL
BUN: 19 mg/dL — ABNORMAL HIGH (ref 7–18)
Calcium, Total: 8.7 mg/dL (ref 8.5–10.1)
Chloride: 111 mmol/L — ABNORMAL HIGH (ref 98–107)
Co2: 29 mmol/L (ref 21–32)
EGFR (African American): 52 — ABNORMAL LOW
Glucose: 101 mg/dL — ABNORMAL HIGH (ref 65–99)
Sodium: 145 mmol/L (ref 136–145)

## 2012-11-05 LAB — CBC WITH DIFFERENTIAL/PLATELET
Basophil #: 0.1 10*3/uL (ref 0.0–0.1)
Eosinophil #: 0.4 10*3/uL (ref 0.0–0.7)
Eosinophil %: 4.2 %
HGB: 9.4 g/dL — ABNORMAL LOW (ref 13.0–18.0)
Lymphocyte %: 12.7 %
MCH: 32.2 pg (ref 26.0–34.0)
MCHC: 35.2 g/dL (ref 32.0–36.0)
Monocyte %: 10.8 %
Neutrophil #: 6.2 10*3/uL (ref 1.4–6.5)
Neutrophil %: 71.5 %
RBC: 2.9 10*6/uL — ABNORMAL LOW (ref 4.40–5.90)
WBC: 8.7 10*3/uL (ref 3.8–10.6)

## 2012-11-05 LAB — FERRITIN: Ferritin (ARMC): 364 ng/mL (ref 8–388)

## 2012-11-05 LAB — IRON AND TIBC
Iron Saturation: 20 %
Unbound Iron-Bind.Cap.: 184 ug/dL

## 2012-11-05 LAB — BASIC METABOLIC PANEL
Anion Gap: 7 (ref 7–16)
Chloride: 109 mmol/L — ABNORMAL HIGH (ref 98–107)
Co2: 27 mmol/L (ref 21–32)
EGFR (African American): 50 — ABNORMAL LOW
Glucose: 117 mg/dL — ABNORMAL HIGH (ref 65–99)
Osmolality: 290 (ref 275–301)
Potassium: 3.7 mmol/L (ref 3.5–5.1)

## 2012-11-08 LAB — CBC WITH DIFFERENTIAL/PLATELET
Basophil #: 0.1 10*3/uL (ref 0.0–0.1)
Basophil %: 0.9 %
Eosinophil #: 0.5 10*3/uL (ref 0.0–0.7)
Eosinophil %: 6.2 %
HGB: 9 g/dL — ABNORMAL LOW (ref 13.0–18.0)
Lymphocyte #: 1 10*3/uL (ref 1.0–3.6)
MCHC: 35.3 g/dL (ref 32.0–36.0)
MCV: 92 fL (ref 80–100)
Monocyte #: 0.7 x10 3/mm (ref 0.2–1.0)
Monocyte %: 9.6 %
Neutrophil #: 5.3 10*3/uL (ref 1.4–6.5)
RBC: 2.77 10*6/uL — ABNORMAL LOW (ref 4.40–5.90)
WBC: 7.6 10*3/uL (ref 3.8–10.6)

## 2012-11-08 LAB — BASIC METABOLIC PANEL
Anion Gap: 7 (ref 7–16)
BUN: 22 mg/dL — ABNORMAL HIGH (ref 7–18)
Chloride: 107 mmol/L (ref 98–107)
EGFR (African American): 53 — ABNORMAL LOW
EGFR (Non-African Amer.): 46 — ABNORMAL LOW
Sodium: 142 mmol/L (ref 136–145)

## 2012-11-09 ENCOUNTER — Encounter: Payer: Self-pay | Admitting: Internal Medicine

## 2012-11-09 LAB — BASIC METABOLIC PANEL
Anion Gap: 9 (ref 7–16)
BUN: 21 mg/dL — ABNORMAL HIGH (ref 7–18)
Chloride: 104 mmol/L (ref 98–107)
Creatinine: 1.28 mg/dL (ref 0.60–1.30)
Glucose: 128 mg/dL — ABNORMAL HIGH (ref 65–99)
Osmolality: 284 (ref 275–301)
Potassium: 3.5 mmol/L (ref 3.5–5.1)

## 2012-11-09 LAB — CBC WITH DIFFERENTIAL/PLATELET
Basophil #: 0.1 10*3/uL (ref 0.0–0.1)
Basophil %: 1.1 %
Eosinophil #: 0.5 10*3/uL (ref 0.0–0.7)
Eosinophil %: 5.5 %
HCT: 24.3 % — ABNORMAL LOW (ref 40.0–52.0)
HGB: 8.8 g/dL — ABNORMAL LOW (ref 13.0–18.0)
Lymphocyte #: 1.1 10*3/uL (ref 1.0–3.6)
MCH: 33.3 pg (ref 26.0–34.0)
MCHC: 36.2 g/dL — ABNORMAL HIGH (ref 32.0–36.0)
Monocyte #: 0.9 x10 3/mm (ref 0.2–1.0)
Neutrophil #: 6.4 10*3/uL (ref 1.4–6.5)
RBC: 2.64 10*6/uL — ABNORMAL LOW (ref 4.40–5.90)
RDW: 14.3 % (ref 11.5–14.5)
WBC: 9 10*3/uL (ref 3.8–10.6)

## 2012-11-10 ENCOUNTER — Encounter: Payer: Self-pay | Admitting: Internal Medicine

## 2012-11-12 ENCOUNTER — Inpatient Hospital Stay: Payer: Self-pay | Admitting: Specialist

## 2012-11-12 LAB — COMPREHENSIVE METABOLIC PANEL
Albumin: 2.4 g/dL — ABNORMAL LOW (ref 3.4–5.0)
Anion Gap: 7 (ref 7–16)
BUN: 17 mg/dL (ref 7–18)
Bilirubin,Total: 0.4 mg/dL (ref 0.2–1.0)
Calcium, Total: 8 mg/dL — ABNORMAL LOW (ref 8.5–10.1)
Chloride: 107 mmol/L (ref 98–107)
Co2: 25 mmol/L (ref 21–32)
Creatinine: 1.55 mg/dL — ABNORMAL HIGH (ref 0.60–1.30)
EGFR (African American): 46 — ABNORMAL LOW
Glucose: 201 mg/dL — ABNORMAL HIGH (ref 65–99)
Potassium: 3.5 mmol/L (ref 3.5–5.1)
SGOT(AST): 30 U/L (ref 15–37)

## 2012-11-12 LAB — URINALYSIS, COMPLETE
RBC,UR: 162706 /HPF (ref 0–5)
Specific Gravity: 1.022 (ref 1.003–1.030)
Squamous Epithelial: NONE SEEN
WBC UR: 3721 /HPF (ref 0–5)

## 2012-11-12 LAB — HEMATOCRIT: HCT: 20.2 % — ABNORMAL LOW (ref 40.0–52.0)

## 2012-11-12 LAB — CBC
HCT: 22.1 % — ABNORMAL LOW (ref 40.0–52.0)
HGB: 7.6 g/dL — ABNORMAL LOW (ref 13.0–18.0)
MCHC: 34.6 g/dL (ref 32.0–36.0)
MCV: 94 fL (ref 80–100)
Platelet: 221 10*3/uL (ref 150–440)
RBC: 2.36 10*6/uL — ABNORMAL LOW (ref 4.40–5.90)

## 2012-11-12 LAB — HEMOGLOBIN: HGB: 7 g/dL — ABNORMAL LOW (ref 13.0–18.0)

## 2012-11-15 ENCOUNTER — Telehealth: Payer: Self-pay | Admitting: Internal Medicine

## 2012-11-15 NOTE — Telephone Encounter (Signed)
Patient called from [facility] to let you know he'll be going home today.  He said he's feeling better and his son will be staying with him.

## 2012-11-15 NOTE — Telephone Encounter (Signed)
Good to hear

## 2012-11-16 ENCOUNTER — Ambulatory Visit: Payer: Medicare Other | Admitting: Internal Medicine

## 2012-11-16 ENCOUNTER — Encounter: Payer: Self-pay | Admitting: Internal Medicine

## 2012-11-16 NOTE — Telephone Encounter (Signed)
d 

## 2012-11-19 ENCOUNTER — Telehealth: Payer: Self-pay | Admitting: Family Medicine

## 2012-11-19 NOTE — Telephone Encounter (Signed)
Okay to resume care.

## 2012-11-19 NOTE — Telephone Encounter (Signed)
Mervyn Skeeters from [facility] requesting order for resumption of care after pt was d/c'd from [facility] last Thursday or Friday.  She can be reached at [phone].

## 2012-11-20 NOTE — Telephone Encounter (Signed)
Spoke with nurse and advised results  

## 2012-11-21 DIAGNOSIS — R339 Retention of urine, unspecified: Secondary | ICD-10-CM

## 2012-11-21 DIAGNOSIS — Z8546 Personal history of malignant neoplasm of prostate: Secondary | ICD-10-CM

## 2012-11-21 DIAGNOSIS — Z466 Encounter for fitting and adjustment of urinary device: Secondary | ICD-10-CM

## 2012-12-25 ENCOUNTER — Encounter: Payer: Self-pay | Admitting: Internal Medicine

## 2013-01-07 ENCOUNTER — Other Ambulatory Visit: Payer: Self-pay | Admitting: Internal Medicine

## 2013-01-07 NOTE — Telephone Encounter (Signed)
Received refill request electronically from pharmacy. Last office visit 10/29/12. Medication is no longer on med sheet. Is it okay to refill?

## 2013-01-08 NOTE — Telephone Encounter (Signed)
I think he very occasionally uses it Check with him If so, okay #[phone] x 0

## 2013-01-08 NOTE — Telephone Encounter (Signed)
rx called into pharmacy

## 2013-01-14 ENCOUNTER — Encounter: Payer: Self-pay | Admitting: Internal Medicine

## 2013-01-21 ENCOUNTER — Encounter: Payer: Self-pay | Admitting: *Deleted

## 2013-01-21 ENCOUNTER — Other Ambulatory Visit: Payer: Self-pay | Admitting: Internal Medicine

## 2013-01-21 DIAGNOSIS — B351 Tinea unguium: Secondary | ICD-10-CM | POA: Insufficient documentation

## 2013-01-22 ENCOUNTER — Encounter: Payer: Self-pay | Admitting: Internal Medicine

## 2013-01-30 ENCOUNTER — Ambulatory Visit: Payer: Medicare Other

## 2013-01-30 HISTORY — PX: TRANSURETHRAL RESECTION OF PROSTATE: SHX73

## 2013-01-31 ENCOUNTER — Ambulatory Visit (INDEPENDENT_AMBULATORY_CARE_PROVIDER_SITE_OTHER): Payer: Medicare Other

## 2013-01-31 ENCOUNTER — Ambulatory Visit (INDEPENDENT_AMBULATORY_CARE_PROVIDER_SITE_OTHER): Payer: Medicare Other | Admitting: Podiatrist

## 2013-01-31 ENCOUNTER — Encounter: Payer: Self-pay | Admitting: Podiatrist

## 2013-01-31 VITALS — BP 122/76 | HR 75 | Resp 16 | Ht 67.0 in | Wt 225.0 lb

## 2013-01-31 DIAGNOSIS — Z23 Encounter for immunization: Secondary | ICD-10-CM

## 2013-01-31 DIAGNOSIS — B351 Tinea unguium: Secondary | ICD-10-CM

## 2013-01-31 DIAGNOSIS — M79609 Pain in unspecified limb: Secondary | ICD-10-CM

## 2013-01-31 MED ORDER — AMOXICILLIN 500 MG PO CAPS: 500.0000 mg | ORAL_CAPSULE | Freq: Four times a day (QID) | ORAL | Status: DC | PRN

## 2013-01-31 NOTE — Progress Notes (Signed)
HPI:  Patient presents today for follow up of foot and nail care. Denies any new complaints today.  Objective:  Patients chart is reviewed.  Neurovascular status unchanged.  Patients nails are thickened, discolored, distrophic, friable and brittle with yellow-brown discoloration. Patient subjectively relates they are painful with shoes and with ambulation in both feet.    Assessment:  Symptomatic onychomycosis  Plan:  Discussed treatment options and alternatives.  The symptomatic toenails were debrided through manual an mechanical means without complication.  Return appointment recommended at routine intervals of 3 months

## 2013-01-31 NOTE — Patient Instructions (Signed)
Call if any concerns arise prior to next visit

## 2013-02-01 ENCOUNTER — Encounter: Payer: Self-pay | Admitting: Internal Medicine

## 2013-02-01 NOTE — Telephone Encounter (Signed)
Spoke with [facility] and all she needs is the order written on a script pad and fax to Penny's attention

## 2013-02-04 ENCOUNTER — Encounter: Payer: Self-pay | Admitting: Internal Medicine

## 2013-02-13 ENCOUNTER — Encounter: Payer: Self-pay | Admitting: Internal Medicine

## 2013-02-15 ENCOUNTER — Telehealth: Payer: Self-pay

## 2013-02-15 NOTE — Telephone Encounter (Signed)
Spoke with patient and he does have urologist at [facility] and that request should be sent to them. I also called [facility] and advised the urologist name and number to forward that request to.

## 2013-02-15 NOTE — Telephone Encounter (Signed)
[name] with [facility] left v/m; pt is ordering catheter supplies from [facility] and request a new catheter lubricant and Felisa Bonier will fax request and previously in Sept [facility] had faxed order for catheter supplies but have not received that fax back and [name] will refax that request also. [name] also will need clinical notes to support catheter needs due to Medicare guidelines.

## 2013-02-15 NOTE — Telephone Encounter (Signed)
.  left message to have patient return my call.  

## 2013-03-12 ENCOUNTER — Ambulatory Visit (INDEPENDENT_AMBULATORY_CARE_PROVIDER_SITE_OTHER): Payer: Medicare Other | Admitting: Internal Medicine

## 2013-03-12 ENCOUNTER — Encounter: Payer: Self-pay | Admitting: Internal Medicine

## 2013-03-12 VITALS — BP 120/70 | HR 67 | Temp 98.5°F | Wt 223.0 lb

## 2013-03-12 DIAGNOSIS — R21 Rash and other nonspecific skin eruption: Secondary | ICD-10-CM

## 2013-03-12 DIAGNOSIS — M109 Gout, unspecified: Secondary | ICD-10-CM

## 2013-03-12 DIAGNOSIS — N400 Enlarged prostate without lower urinary tract symptoms: Secondary | ICD-10-CM

## 2013-03-12 MED ORDER — COLCHICINE 0.6 MG PO TABS
0.6000 mg | ORAL_TABLET | Freq: Two times a day (BID) | ORAL | Status: DC | PRN
Start: 2013-03-12 — End: 2013-09-05

## 2013-03-12 MED ORDER — TRIAMCINOLONE ACETONIDE 0.1 % EX CREA: 1.0000 "application " | TOPICAL_CREAM | Freq: Two times a day (BID) | CUTANEOUS | Status: AC | PRN

## 2013-03-12 NOTE — Assessment & Plan Note (Signed)
Had flare when on MTX colcrys helped Will give Rx

## 2013-03-12 NOTE — Progress Notes (Signed)
Pre-visit discussion using our clinic review tool. No additional management support is needed unless otherwise documented below in the visit note.  

## 2013-03-12 NOTE — Assessment & Plan Note (Signed)
Obstruction resolved with TURP May not need oxybutynin or tamsulosin over time

## 2013-03-12 NOTE — Assessment & Plan Note (Signed)
Eczematous type rash Will give cortisone cream---discussed moisturizers or even urea type cream

## 2013-03-12 NOTE — Progress Notes (Signed)
Subjective:    Patient ID: [name], male    DOB: 1922/06/13, 79 y.o.   MRN: [id number]  HPI reviewed his prostate history Had TURP last month (10/23---had foley for a week after) No Foley now Does void best when he has spasm Some leaking---has to wear protection Still on flomax and oxybutynin.  Noticed a rash on his legs about 3 days ago Thinks it is similar to rash a few years ago--told it was fungal Crusting along lateral left leg Tried clortrimazole-- seems about the same Doesn't feel it much due to his neuropathy--slight tingling only  Current Outpatient Prescriptions on File Prior to Visit  Medication Sig Dispense Refill  . amoxicillin (AMOXIL) 500 MG capsule Take 1 capsule (500 mg total) by mouth 4 (four) times daily as needed (take 4 tablets 1 hour prior to appointment).  30 capsule  0  . aspirin 81 MG tablet Take 81 mg by mouth daily.        Marland Kitchen aspirin-acetaminophen-caffeine (EXCEDRIN MIGRAINE) 250-250-65 MG per tablet Take 2 tablets by mouth as needed for pain.      . butalbital-acetaminophen-caffeine (FIORICET, ESGIC) 50-325-40 MG per tablet TAKE 1 - 2 TABLETS BY MOUTH EVERY 6 HOURS AS NEEDED FOR HEADACHE.  30 tablet  0  . Cyanocobalamin (VITAMIN B-12) 5000 MCG SUBL Place 1 tablet (5,000 mcg total) under the tongue daily.  30 tablet  11  . donepezil (ARICEPT) 10 MG tablet TAKE 1 TABLET AT BEDTIME  90 tablet  0  . fish oil-omega-3 fatty acids 1000 MG capsule Take 1 g by mouth daily.        . folic acid (FOLVITE) 800 MCG tablet Take 1 tablet (800 mcg total) by mouth daily.      . Multiple Vitamin (MULTIVITAMIN) tablet Take 1 tablet by mouth daily.        Marland Kitchen oxybutynin (DITROPAN) 5 MG tablet TAKE 1 TABLET (5 MG TOTAL) BY MOUTH DAILY.  90 tablet  3  . propranolol ER (INDERAL LA) 60 MG 24 hr capsule Take 1 capsule (60 mg total) by mouth daily.  90 capsule  3  . psyllium (METAMUCIL) 58.6 % powder Take 1 packet by mouth daily.         No current facility-administered  medications on file prior to visit.    Allergies  Allergen Reactions  . Bactrim [Sulfamethoxazole-Tmp Ds] Other (See Comments)    AFFECTED BLADDER  . Cephalexin     REACTION: intestinal distress    Past Medical History  Diagnosis Date  . Mild cognitive impairment   . Neuropathy   . History of prostate cancer     lupron/casodex x6 months then RT  . BPH (benign prostatic hypertrophy)   . OSA (obstructive sleep apnea)     CPAP 13  . OA (osteoarthritis)   . SVT (supraventricular tachycardia)   . History of nephrolithiasis   . Diverticulosis of colon   . Urinary incontinence     med/briefs  . Bladder spasms   . Dermatophytosis of nail [id number]    Past Surgical History  Procedure Laterality Date  . Tonsillectomy    . Transurethral resection of prostate  1990  . Svt ablation  1990  . Total knee arthroplasty  ~2007    Left  . Eyelid reduction  2002/2006  . Cataract extraction      OU then laser Rx to both eyes  . Transurethral resection of prostate  10/14    [facility]    Family History  Problem Relation Age of Onset  . Cancer Mother     breast  . Nephritis Father   . Crohn's disease Brother   . Diabetes Son   . Cancer Sister   . Coronary artery disease Neg Hx   . Hypertension Neg Hx   . Colon cancer Neg Hx     History   Social History  . Marital Status: Widowed    Spouse Name: N/A    Number of Children: 2  . Years of Education: N/A   Occupational History  . retired     Social research officer, government; PhD in applied leadership/organizations   Social History Main Topics  . Smoking status: Former Smoker    Types: Cigarettes    Quit date: 02/04/1966  . Smokeless tobacco: Never Used  . Alcohol Use: Yes     Comment: occasional  . Drug Use: No  . Sexual Activity: Not on file   Other Topics Concern  . Not on file   Social History Narrative   Expects to make nephew Precious Gilding his health care POA--lives in [place]. Has living will. Requests DNR very  strongly. Would not accept tube feedings         Review of Systems No fever Doesn't feel sick but appetite is off since the surgery     Objective:   Physical Exam  Constitutional: He appears well-developed. No distress.  Skin:  Eczematous type crusted rash along lower left calf No bleeding with scraping          Assessment & Plan:

## 2013-04-01 ENCOUNTER — Encounter: Payer: Self-pay | Admitting: Internal Medicine

## 2013-04-05 ENCOUNTER — Encounter: Payer: Self-pay | Admitting: Internal Medicine

## 2013-04-24 ENCOUNTER — Ambulatory Visit (INDEPENDENT_AMBULATORY_CARE_PROVIDER_SITE_OTHER): Payer: Medicare Other | Admitting: Internal Medicine

## 2013-04-24 ENCOUNTER — Encounter: Payer: Self-pay | Admitting: Internal Medicine

## 2013-04-24 ENCOUNTER — Other Ambulatory Visit: Payer: Self-pay | Admitting: Internal Medicine

## 2013-04-24 VITALS — BP 130/80 | HR 65 | Ht 67.0 in | Wt 214.0 lb

## 2013-04-24 DIAGNOSIS — G3184 Mild cognitive impairment, so stated: Secondary | ICD-10-CM

## 2013-04-24 DIAGNOSIS — E039 Hypothyroidism, unspecified: Secondary | ICD-10-CM

## 2013-04-24 DIAGNOSIS — M109 Gout, unspecified: Secondary | ICD-10-CM

## 2013-04-24 DIAGNOSIS — Z0000 Encounter for general adult medical examination without abnormal findings: Secondary | ICD-10-CM | POA: Insufficient documentation

## 2013-04-24 DIAGNOSIS — E538 Deficiency of other specified B group vitamins: Secondary | ICD-10-CM

## 2013-04-24 DIAGNOSIS — I498 Other specified cardiac arrhythmias: Secondary | ICD-10-CM

## 2013-04-24 DIAGNOSIS — C61 Malignant neoplasm of prostate: Secondary | ICD-10-CM

## 2013-04-24 LAB — BASIC METABOLIC PANEL
BUN: 20 mg/dL (ref 6–23)
CO2: 29 mEq/L (ref 19–32)
Calcium: 9.7 mg/dL (ref 8.4–10.5)
Chloride: 103 mEq/L (ref 96–112)
Creatinine, Ser: 0.8 mg/dL (ref 0.4–1.5)
GFR: 94.19 mL/min (ref >60.00)

## 2013-04-24 LAB — CBC WITH DIFFERENTIAL/PLATELET
Basophils Absolute: 0 10*3/uL (ref 0.0–0.1)
Basophils Relative: 0.3 % (ref 0.0–3.0)
Eosinophils Relative: 2.9 % (ref 0.0–5.0)
Lymphocytes Relative: 16.8 % (ref 12.0–46.0)
Monocytes Absolute: 0.9 10*3/uL (ref 0.1–1.0)
Monocytes Relative: 11.4 % (ref 3.0–12.0)
Neutrophils Relative %: 68.6 % (ref 43.0–77.0)
Platelets: 184 10*3/uL (ref 150.0–400.0)
RBC: 4.03 Mil/uL — ABNORMAL LOW (ref 4.22–5.81)
WBC: 7.9 10*3/uL (ref 4.5–10.5)

## 2013-04-24 LAB — HEPATIC FUNCTION PANEL
AST: 17 U/L (ref 0–37)
Albumin: 3.7 g/dL (ref 3.5–5.2)
Alkaline Phosphatase: 63 U/L (ref 39–117)
Bilirubin, Direct: 0.1 mg/dL (ref 0.0–0.3)

## 2013-04-24 LAB — VITAMIN B12: Vitamin B-12: 1135 pg/mL — ABNORMAL HIGH (ref 211–911)

## 2013-04-24 LAB — URIC ACID: Uric Acid, Serum: 4.8 mg/dL (ref 4.0–7.8)

## 2013-04-24 LAB — TSH: TSH: 0 u[IU]/mL — ABNORMAL LOW (ref 0.35–5.50)

## 2013-04-24 NOTE — Assessment & Plan Note (Signed)
Did have new focus of Gleason 7 cancer on recent TURP Going to see oncologist---doubt Rx would be appropriate

## 2013-04-24 NOTE — Assessment & Plan Note (Signed)
I have personally reviewed the Medicare Annual Wellness questionnaire and have noted 1. The patient's medical and social history 2. Their use of alcohol, tobacco or illicit drugs 3. Their current medications and supplements 4. The patient's functional ability including ADL's, fall risks, home safety risks and hearing or visual             impairment. 5. Diet and physical activities 6. Evidence for depression or mood disorders  The patients weight, height, BMI and visual acuity have been recorded in the chart I have made referrals, counseling and provided education to the patient based review of the above and I have provided the pt with a written personalized care plan for preventive services.  I have provided you with a copy of your personalized plan for preventive services. Please take the time to review along with your updated medication list.  No cancer screening due to age UTD on imms Counseling done

## 2013-04-24 NOTE — Assessment & Plan Note (Signed)
No symptoms 

## 2013-04-24 NOTE — Assessment & Plan Note (Signed)
Clinically euthyroid without meds Will check labs

## 2013-04-24 NOTE — Progress Notes (Signed)
Subjective:    Patient ID: [name], male    DOB: 09-May-1922, 79 y.o.   MRN: [id number]  HPI Here for Medicare wellness and follow up Still drives and is independent in all instrumental ADLs No depression or anhedonia No exercise---discussed this No falls in the past year---- did bang door and hurt shoulder. Getting PT for this Uses walker or cane Only occasional alcohol--none recently. No tobacco. Stable cognitive issues Chronic hearing problems---uses aides Vision is okay---keeps up with eye doctor. Glaucoma suspect on left Wellness form reviewed  Still feels aftereffects from October surgery Trouble getting his energy back Crashes after 5-6 hours--- needs naps now Voiding better now Passes "strip of tissue" a few days ago. Had brief urgency with no success---then this came out with a "pop" Now voiding better with improved flow Spasm is better---but still gets some urgency Still incontinent and uses diapers or pads  Has had hard time "getting ready for Christmas" Can't get motivated to do the hours of work for cards, etc.  Wound up being late Not depressed Still plays bridge, has his social outlets---etc  Undergoing memory study related to his recent anaesthesia Even had LP No worsening of his stable cognitive impairment  No palpitations No chest pain Gets DOE after the surgery--better now Sleeps with the CPAP still No edema No syncope   Current Outpatient Prescriptions on File Prior to Visit  Medication Sig Dispense Refill  . aspirin 81 MG tablet Take 81 mg by mouth daily.        Marland Kitchen aspirin-acetaminophen-caffeine (EXCEDRIN MIGRAINE) 250-250-65 MG per tablet Take 2 tablets by mouth as needed for pain.      . butalbital-acetaminophen-caffeine (FIORICET, ESGIC) 50-325-40 MG per tablet TAKE 1 - 2 TABLETS BY MOUTH EVERY 6 HOURS AS NEEDED FOR HEADACHE.  30 tablet  0  . colchicine 0.6 MG tablet Take 1 tablet (0.6 mg total) by mouth 2 (two) times daily as needed.  60  tablet  1  . Cyanocobalamin (VITAMIN B-12) 5000 MCG SUBL Place 1 tablet (5,000 mcg total) under the tongue daily.  30 tablet  11  . fish oil-omega-3 fatty acids 1000 MG capsule Take 1 g by mouth daily.        . folic acid (FOLVITE) 800 MCG tablet Take 1 tablet (800 mcg total) by mouth daily.      . Multiple Vitamin (MULTIVITAMIN) tablet Take 1 tablet by mouth daily.        . propranolol ER (INDERAL LA) 60 MG 24 hr capsule Take 1 capsule (60 mg total) by mouth daily.  90 capsule  3  . psyllium (METAMUCIL) 58.6 % powder Take 1 packet by mouth daily.        . tamsulosin (FLOMAX) 0.4 MG CAPS capsule Take 0.4 mg by mouth daily.       Marland Kitchen triamcinolone cream (KENALOG) 0.1 % Apply 1 application topically 2 (two) times daily as needed.  30 g  0   No current facility-administered medications on file prior to visit.    Allergies  Allergen Reactions  . Bactrim [Sulfamethoxazole-Tmp Ds] Other (See Comments)    AFFECTED BLADDER  . Cephalexin     REACTION: intestinal distress    Past Medical History  Diagnosis Date  . Mild cognitive impairment   . Neuropathy   . History of prostate cancer     lupron/casodex x6 months then RT  . BPH (benign prostatic hypertrophy)   . OSA (obstructive sleep apnea)     CPAP  13  . OA (osteoarthritis)   . SVT (supraventricular tachycardia)   . History of nephrolithiasis   . Diverticulosis of colon   . Urinary incontinence     med/briefs  . Bladder spasms   . Dermatophytosis of nail [id number]  . Gout     Past Surgical History  Procedure Laterality Date  . Tonsillectomy    . Transurethral resection of prostate  1990  . Svt ablation  1990  . Total knee arthroplasty  ~2007    Left  . Eyelid reduction  2002/2006  . Cataract extraction      OU then laser Rx to both eyes  . Transurethral resection of prostate  10/14    [facility]    Family History  Problem Relation Age of Onset  . Cancer Mother     breast  . Nephritis Father   . Crohn's disease Brother   .  Diabetes Son   . Cancer Sister   . Coronary artery disease Neg Hx   . Hypertension Neg Hx   . Colon cancer Neg Hx     History   Social History  . Marital Status: Widowed    Spouse Name: N/A    Number of Children: 2  . Years of Education: N/A   Occupational History  . retired     Social research officer, government; PhD in applied leadership/organizations   Social History Main Topics  . Smoking status: Former Smoker    Types: Cigarettes    Quit date: 02/04/1966  . Smokeless tobacco: Never Used  . Alcohol Use: Yes     Comment: occasional  . Drug Use: No  . Sexual Activity: Not on file   Other Topics Concern  . Not on file   Social History Narrative   Nephew Precious Gilding is his health care POA--lives in [place].    Has living will.    Requests DNR very strongly.    Would not accept tube feedings            Review of Systems Appetite off some since surgery Weight is down 9#---he is happy with this Sleeps okay--lots of nocturia still (better on the trospium) Some dry mouth--not bad Balance is not great Bowels have been okay on senekot Rash is better Uses exedrin for headache about once a week    Objective:   Physical Exam  Constitutional: He is oriented to person, place, and time. He appears well-developed and well-nourished. No distress.  HENT:  Mouth/Throat: Oropharynx is clear and moist. No oropharyngeal exudate.  Neck: Normal range of motion. Neck supple. No thyromegaly present.  Cardiovascular: Normal rate, regular rhythm, normal heart sounds and intact distal pulses.  Exam reveals no gallop.   No murmur heard. Pulmonary/Chest: Effort normal and breath sounds normal. No respiratory distress. He has no wheezes. He has no rales.  Abdominal: Soft. There is no tenderness.  Musculoskeletal: He exhibits no edema and no tenderness.  Lymphadenopathy:    He has no cervical adenopathy.  Neurological: He is alert and oriented to person, place, and time.    President-- "Susa Loffler, Bush, the other Bush" [id number] Recall 2/3  Skin: No rash noted.  Mycotic toenails  Psychiatric: He has a normal mood and affect. His behavior is normal.          Assessment & Plan:

## 2013-04-24 NOTE — Assessment & Plan Note (Signed)
Has been quiet 

## 2013-04-24 NOTE — Assessment & Plan Note (Signed)
Actually quite good Will continue the donepezil since it may be helping

## 2013-04-24 NOTE — Patient Instructions (Addendum)
Exercise to Lose Weight Exercise and a healthy diet may help you lose weight. Your doctor may suggest specific exercises. EXERCISE IDEAS AND TIPS  Choose low-cost things you enjoy doing, such as walking, bicycling, or exercising to workout videos.  Take stairs instead of the elevator.  Walk during your lunch break.  Park your car further away from work or school.  Go to a gym or an exercise class.  Start with 5 to 10 minutes of exercise each day. Build up to 30 minutes of exercise 4 to 6 days a week.  Wear shoes with good support and comfortable clothes.  Stretch before and after working out.  Work out until you breathe harder and your heart beats faster.  Drink extra water when you exercise.  Do not do so much that you hurt yourself, feel dizzy, or get very short of breath. Exercises that burn about 150 calories:  Running 1  miles in 15 minutes.  Playing volleyball for 45 to 60 minutes.  Washing and waxing a car for 45 to 60 minutes.  Playing touch football for 45 minutes.  Walking 1  miles in 35 minutes.  Pushing a stroller 1  miles in 30 minutes.  Playing basketball for 30 minutes.  Raking leaves for 30 minutes.  Bicycling 5 miles in 30 minutes.  Walking 2 miles in 30 minutes.  Dancing for 30 minutes.  Shoveling snow for 15 minutes.  Swimming laps for 20 minutes.  Walking up stairs for 15 minutes.  Bicycling 4 miles in 15 minutes.  Gardening for 30 to 45 minutes.  Jumping rope for 15 minutes.  Washing windows or floors for 45 to 60 minutes. Document Released: 05/21/2010 Document Revised: 07/11/2011 Document Reviewed: 05/21/2010 ExitCare Patient Information 2014 [employer] DASH Diet The DASH diet stands for "Dietary Approaches to Stop Hypertension." It is a healthy eating plan that has been shown to reduce high blood pressure (hypertension) in as little as 14 days, while also possibly providing other significant health benefits. These other  health benefits include reducing the risk of breast cancer after menopause and reducing the risk of type 2 diabetes, heart disease, colon cancer, and stroke. Health benefits also include weight loss and slowing kidney failure in patients with chronic kidney disease.  DIET GUIDELINES  Limit salt (sodium). Your diet should contain less than 1500 mg of sodium daily.  Limit refined or processed carbohydrates. Your diet should include mostly whole grains. Desserts and added sugars should be used sparingly.  Include small amounts of heart-healthy fats. These types of fats include nuts, oils, and tub margarine. Limit saturated and trans fats. These fats have been shown to be harmful in the body. CHOOSING FOODS  The following food groups are based on a 2000 calorie diet. See your Registered Dietitian for individual calorie needs. Grains and Grain Products (6 to 8 servings daily)  Eat More Often: Whole-wheat bread, brown rice, whole-grain or wheat pasta, quinoa, popcorn without added fat or salt (air popped).  Eat Less Often: White bread, white pasta, white rice, cornbread. Vegetables (4 to 5 servings daily)  Eat More Often: Fresh, frozen, and canned vegetables. Vegetables may be raw, steamed, roasted, or grilled with a minimal amount of fat.  Eat Less Often/Avoid: Creamed or fried vegetables. Vegetables in a cheese sauce. Fruit (4 to 5 servings daily)  Eat More Often: All fresh, canned (in natural juice), or frozen fruits. Dried fruits without added sugar. One hundred percent fruit juice ( cup [237 mL] daily).    Eat Less Often: Dried fruits with added sugar. Canned fruit in light or heavy syrup. Lean Meats, Fish, and Poultry (2 servings or less daily. One serving is 3 to 4 oz [85-114 g]).  Eat More Often: Ninety percent or leaner ground beef, tenderloin, sirloin. Round cuts of beef, chicken breast, turkey breast. All fish. Grill, bake, or broil your meat. Nothing should be fried.  Eat Less  Often/Avoid: Fatty cuts of meat, turkey, or chicken leg, thigh, or wing. Fried cuts of meat or fish. Dairy (2 to 3 servings)  Eat More Often: Low-fat or fat-free milk, low-fat plain or light yogurt, reduced-fat or part-skim cheese.  Eat Less Often/Avoid: Milk (whole, 2%).Whole milk yogurt. Full-fat cheeses. Nuts, Seeds, and Legumes (4 to 5 servings per week)  Eat More Often: All without added salt.  Eat Less Often/Avoid: Salted nuts and seeds, canned beans with added salt. Fats and Sweets (limited)  Eat More Often: Vegetable oils, tub margarines without trans fats, sugar-free gelatin. Mayonnaise and salad dressings.  Eat Less Often/Avoid: Coconut oils, palm oils, butter, stick margarine, cream, half and half, cookies, candy, pie. FOR MORE INFORMATION The Dash Diet Eating Plan: www.dashdiet.org Document Released: 04/07/2011 Document Revised: 07/11/2011 Document Reviewed: 04/07/2011 ExitCare Patient Information 2014 [employer].  

## 2013-04-24 NOTE — Progress Notes (Signed)
Pre-visit discussion using our clinic review tool. No additional management support is needed unless otherwise documented below in the visit note.  

## 2013-05-02 DIAGNOSIS — C459 Mesothelioma, unspecified: Secondary | ICD-10-CM

## 2013-05-02 HISTORY — DX: Mesothelioma, unspecified: C45.9

## 2013-05-09 ENCOUNTER — Encounter: Payer: Self-pay | Admitting: Podiatrist

## 2013-05-09 ENCOUNTER — Ambulatory Visit (INDEPENDENT_AMBULATORY_CARE_PROVIDER_SITE_OTHER): Payer: Medicare Other | Admitting: Podiatrist

## 2013-05-09 ENCOUNTER — Encounter: Payer: Self-pay | Admitting: Internal Medicine

## 2013-05-09 VITALS — BP 120/72 | HR 86 | Resp 16

## 2013-05-09 DIAGNOSIS — M79609 Pain in unspecified limb: Secondary | ICD-10-CM

## 2013-05-09 DIAGNOSIS — B351 Tinea unguium: Secondary | ICD-10-CM

## 2013-05-09 NOTE — Progress Notes (Signed)
HPI:  Patient presents today for follow up of foot and nail care. Denies any new complaints today.  Objective:  Patients chart is reviewed.  Neurovascular status unchanged.  Patients nails are thickened, discolored, distrophic, friable and brittle with yellow-brown discoloration. Patient subjectively relates they are painful with shoes and with ambulation of bilateral feet.  Callus tip of right 3rd toe present-- it has ulcerated in the past but looks healed today with intact integument noted post debridement  Assessment:  Symptomatic onychomycosis  Plan:  Discussed treatment options and alternatives.  The symptomatic toenails were debrided through manual an mechanical means without complication.  Return appointment recommended at routine intervals of 3 months    Trudie Buckler, DPM

## 2013-06-02 ENCOUNTER — Encounter: Payer: Self-pay | Admitting: Internal Medicine

## 2013-06-12 ENCOUNTER — Encounter: Payer: Self-pay | Admitting: Internal Medicine

## 2013-06-13 ENCOUNTER — Encounter: Payer: Self-pay | Admitting: Internal Medicine

## 2013-06-14 ENCOUNTER — Encounter: Payer: Self-pay | Admitting: Internal Medicine

## 2013-06-17 ENCOUNTER — Encounter: Payer: Self-pay | Admitting: Internal Medicine

## 2013-07-03 ENCOUNTER — Encounter: Payer: Self-pay | Admitting: Internal Medicine

## 2013-07-04 ENCOUNTER — Ambulatory Visit (INDEPENDENT_AMBULATORY_CARE_PROVIDER_SITE_OTHER): Payer: Medicare Other | Admitting: Internal Medicine

## 2013-07-04 ENCOUNTER — Encounter: Payer: Self-pay | Admitting: Internal Medicine

## 2013-07-04 ENCOUNTER — Ambulatory Visit (INDEPENDENT_AMBULATORY_CARE_PROVIDER_SITE_OTHER)
Admission: RE | Admit: 2013-07-04 | Discharge: 2013-07-04 | Disposition: A | Discharge status: Home / Self Care | Payer: Medicare Other | Source: Ambulatory Visit | Attending: Internal Medicine | Admitting: Internal Medicine

## 2013-07-04 VITALS — BP 120/60 | HR 87 | Temp 99.4°F | Resp 18 | Wt 209.0 lb

## 2013-07-04 DIAGNOSIS — R0789 Other chest pain: Secondary | ICD-10-CM

## 2013-07-04 MED ORDER — LEVOFLOXACIN 500 MG PO TABS: 500.0000 mg | ORAL_TABLET | Freq: Every day | ORAL | Status: DC

## 2013-07-04 NOTE — Assessment & Plan Note (Addendum)
EKG shows sinus with RBBB---nothing new there CXR shows ?infiltrate at LLL or effusion Even if not pneumonia--seems like bronchitis Will treat with levaquin

## 2013-07-04 NOTE — Patient Instructions (Signed)
Please call if you are not feeling better by Monday.  Call 911 if you get worse

## 2013-07-04 NOTE — Progress Notes (Signed)
Subjective:    Patient ID: [name], male    DOB: 03-Apr-1923, 80 y.o.   MRN: [id number]  HPI Not feeling good  Has sense in middle of chest--like he can't get a good breath. Restrictive feeling DOE with minimal exertion--like cleaning up dishes,etc. A definite change Gets better with rest Finds he has to stay seating or lying down a lot Some dry cough--gets some mucus but winds up swallowing May have low grade fever at most No sweats or chills No overt chest pain No palpitations No edema Sleeps flat in bed. No PND Nocturia every 1.5-2 hours---stable  Current Outpatient Prescriptions on File Prior to Visit  Medication Sig Dispense Refill  . amoxicillin (AMOXIL) 500 MG capsule Take 2,000 mg by mouth once. Before dental appts      . aspirin 81 MG tablet Take 81 mg by mouth daily.        Marland Kitchen aspirin-acetaminophen-caffeine (EXCEDRIN MIGRAINE) 250-250-65 MG per tablet Take 2 tablets by mouth as needed for pain.      . butalbital-acetaminophen-caffeine (FIORICET, ESGIC) 50-325-40 MG per tablet TAKE 1 - 2 TABLETS BY MOUTH EVERY 6 HOURS AS NEEDED FOR HEADACHE.  30 tablet  0  . colchicine 0.6 MG tablet Take 1 tablet (0.6 mg total) by mouth 2 (two) times daily as needed.  60 tablet  1  . Cyanocobalamin (VITAMIN B-12) 5000 MCG SUBL Place under the tongue.      . donepezil (ARICEPT) 10 MG tablet TAKE 1 TABLET AT BEDTIME  90 tablet  0  . fish oil-omega-3 fatty acids 1000 MG capsule Take 1 g by mouth daily.        . folic acid (FOLVITE) 528 MCG tablet Take 1 tablet (800 mcg total) by mouth daily.      . Multiple Vitamin (MULTIVITAMIN) tablet Take 1 tablet by mouth daily.        . propranolol ER (INDERAL LA) 60 MG 24 hr capsule Take 1 capsule (60 mg total) by mouth daily.  90 capsule  3  . psyllium (METAMUCIL) 58.6 % powder Take 1 packet by mouth daily.        . tamsulosin (FLOMAX) 0.4 MG CAPS capsule Take 0.4 mg by mouth daily.       Marland Kitchen triamcinolone cream (KENALOG) 0.1 % Apply 1 application  topically 2 (two) times daily as needed.  30 g  0  . Trospium Chloride 60 MG CP24 Take 1 capsule by mouth daily before breakfast.        No current facility-administered medications on file prior to visit.    Allergies  Allergen Reactions  . Bactrim [Sulfamethoxazole-Tmp Ds] Other (See Comments)    AFFECTED BLADDER  . Cephalexin     REACTION: intestinal distress    Past Medical History  Diagnosis Date  . Mild cognitive impairment   . Neuropathy   . History of prostate cancer     lupron/casodex x6 months then RT  . BPH (benign prostatic hypertrophy)   . OSA (obstructive sleep apnea)     CPAP 13  . OA (osteoarthritis)   . SVT (supraventricular tachycardia)   . History of nephrolithiasis   . Diverticulosis of colon   . Urinary incontinence     med/briefs  . Bladder spasms   . Dermatophytosis of nail [id number]  . Gout     Past Surgical History  Procedure Laterality Date  . Tonsillectomy    . Transurethral resection of prostate  1990  . Svt ablation  1990  .  Total knee arthroplasty  ~2007    Left  . Eyelid reduction  2002/2006  . Cataract extraction      OU then laser Rx to both eyes  . Transurethral resection of prostate  10/14    [facility]    Family History  Problem Relation Age of Onset  . Cancer Mother     breast  . Nephritis Father   . Crohn's disease Brother   . Diabetes Son   . Cancer Sister   . Coronary artery disease Neg Hx   . Hypertension Neg Hx   . Colon cancer Neg Hx     History   Social History  . Marital Status: Widowed    Spouse Name: N/A    Number of Children: 2  . Years of Education: N/A   Occupational History  . retired     Financial controller; PhD in applied leadership/organizations   Social History Main Topics  . Smoking status: Former Smoker    Types: Cigarettes    Quit date: 02/04/1966  . Smokeless tobacco: Never Used  . Alcohol Use: Yes     Comment: occasional  . Drug Use: No  . Sexual Activity: Not on file    Other Topics Concern  . Not on file   Social History Narrative   Nephew Grover Canavan is his health care POA--lives in [place].    Has living will.    Requests DNR very strongly.    Would not accept tube feedings            Review of Systems "perpetual headache" Sleeping well Some vague dizziness    Objective:   Physical Exam  Constitutional: He appears well-developed. No distress.  Neck: Normal range of motion. Neck supple. No thyromegaly present.  Cardiovascular: Normal rate, normal heart sounds and intact distal pulses.  Exam reveals no gallop.   No murmur heard. Seems regular but with frequent ectopy  Pulmonary/Chest: Effort normal and breath sounds normal. No respiratory distress. He has no wheezes. He has no rales.  No dullness   Abdominal: Soft. There is no tenderness.  Musculoskeletal: He exhibits no edema.  Lymphadenopathy:    He has no cervical adenopathy.  Psychiatric: He has a normal mood and affect. His behavior is normal.          Assessment & Plan:

## 2013-07-04 NOTE — Progress Notes (Signed)
Pre visit review using our clinic review tool, if applicable. No additional management support is needed unless otherwise documented below in the visit note. 

## 2013-07-05 ENCOUNTER — Telehealth: Payer: Self-pay

## 2013-07-05 ENCOUNTER — Other Ambulatory Visit: Payer: Self-pay | Admitting: Internal Medicine

## 2013-07-05 ENCOUNTER — Other Ambulatory Visit (INDEPENDENT_AMBULATORY_CARE_PROVIDER_SITE_OTHER): Payer: Medicare Other

## 2013-07-05 DIAGNOSIS — R918 Other nonspecific abnormal finding of lung field: Secondary | ICD-10-CM

## 2013-07-05 DIAGNOSIS — R5381 Other malaise: Secondary | ICD-10-CM

## 2013-07-05 DIAGNOSIS — R5383 Other fatigue: Secondary | ICD-10-CM

## 2013-07-05 LAB — BASIC METABOLIC PANEL
BUN: 15 mg/dL (ref 6–23)
CALCIUM: 9.2 mg/dL (ref 8.4–10.5)
CO2: 26 meq/L (ref 19–32)
CREATININE: 0.8 mg/dL (ref 0.4–1.5)
Chloride: 103 mEq/L (ref 96–112)
GFR: 94.15 mL/min (ref >60.00)
Glucose, Bld: 102 mg/dL — ABNORMAL HIGH (ref 70–99)
Potassium: 3.7 mEq/L (ref 3.5–5.1)
Sodium: 137 mEq/L (ref 135–145)

## 2013-07-05 NOTE — Telephone Encounter (Signed)
Yes, I got it last night I will call him today to discuss

## 2013-07-05 NOTE — Telephone Encounter (Signed)
[name] with [facility] called report CXR; CXR result in Epic under imaging tab. Verbally advised report available.

## 2013-07-06 ENCOUNTER — Encounter: Payer: Self-pay | Admitting: Internal Medicine

## 2013-07-07 ENCOUNTER — Encounter: Payer: Self-pay | Admitting: Internal Medicine

## 2013-07-08 ENCOUNTER — Encounter: Payer: Self-pay | Admitting: Internal Medicine

## 2013-07-08 ENCOUNTER — Ambulatory Visit: Payer: Self-pay | Admitting: Internal Medicine

## 2013-07-09 ENCOUNTER — Other Ambulatory Visit: Payer: Self-pay | Admitting: Internal Medicine

## 2013-07-09 ENCOUNTER — Telehealth: Payer: Self-pay | Admitting: Internal Medicine

## 2013-07-09 DIAGNOSIS — R918 Other nonspecific abnormal finding of lung field: Secondary | ICD-10-CM

## 2013-07-09 NOTE — Telephone Encounter (Signed)
FYI, Patient set up at [facility] for 07/11/13 Pet Scan at 7am, Pulm Funct Test at 9:20am,Brain MRI at 1:45pm. On Friday, March 13th at 10am he will see Dr Richrd Prime then go to [place] and then at 4:30pm will have the procedure done . Patient is aware of all appts.

## 2013-07-10 NOTE — Telephone Encounter (Signed)
Wow, great job. Thanks for your help---this really needed to be done quickly!

## 2013-07-12 ENCOUNTER — Encounter: Payer: Self-pay | Admitting: Internal Medicine

## 2013-07-26 ENCOUNTER — Other Ambulatory Visit: Payer: Self-pay | Admitting: Internal Medicine

## 2013-08-06 ENCOUNTER — Encounter: Payer: Self-pay | Admitting: Internal Medicine

## 2013-08-07 ENCOUNTER — Telehealth: Payer: Self-pay

## 2013-08-07 NOTE — Telephone Encounter (Signed)
Spoke with hospice nurse and she was ok with the decesion but wanted [place] to discuss with the patient. He will call for an appt next week to discuss with Dr. Silvio Pate because he still wants to use [facility] but also states he doesn't want to get into a "terf war"

## 2013-08-07 NOTE — Telephone Encounter (Signed)
[name] with [facility] left v/m; pt has agreed to home hospice services and wants to know if Dr Silvio Pate will follow pt for home hospice services.[name] request cb.

## 2013-08-07 NOTE — Telephone Encounter (Signed)
I do not want [facility] to provide hospice services I have a close relationship with [facility] for my [facility] patients and they have a nurse on campus every day I will arrange the hospice services with him  Karena Addison, Have him set up for appt soon to discuss his care

## 2013-08-08 ENCOUNTER — Ambulatory Visit (INDEPENDENT_AMBULATORY_CARE_PROVIDER_SITE_OTHER): Payer: Medicare Other | Admitting: Podiatrist

## 2013-08-08 VITALS — Resp 16 | Ht 67.0 in | Wt 203.0 lb

## 2013-08-08 DIAGNOSIS — B351 Tinea unguium: Secondary | ICD-10-CM

## 2013-08-08 DIAGNOSIS — M79609 Pain in unspecified limb: Secondary | ICD-10-CM

## 2013-08-08 NOTE — Telephone Encounter (Signed)
I will discuss this at his visit and we will make a decision.  I think he can get better care from the local Hospice but he has strong ties with [facility]

## 2013-08-08 NOTE — Patient Instructions (Signed)
  Watch your toenails for any signs of infection including drainage, pus redness or swelling along the sides of the toenails.  Soak in epsom salt water and use antibiotic ointment (OTC) if you notice this start to occur.  If the redness does not resolve within 2-3 days, call for an appointment to be seen.  

## 2013-08-08 NOTE — Progress Notes (Signed)
HPI:  Patient presents today for follow up of foot and nail care. Patient was just diagnosed with Multiple Myeloma and is having some shortness of breath. He relates he is still having some baseline neuropathy in his feet.   Objective:  Patients chart is reviewed.  Vascular status reveals pedal pulses noted at 1 out of 4 dp and pt bilateral .  Neurological sensation is slightly decreased to [employer] monofilament bilateral at 1/5 sites.  Patients nails are thickened, discolored, distrophic, friable and brittle with yellow-brown discoloration. Incurvation of nail borders present bilateral hallux nails.  Patient subjectively relates they are painful with shoes and with ambulation of bilateral feet.  Assessment:  Symptomatic onychomycosis  Plan:  Discussed treatment options and alternatives.  The symptomatic toenails were debrided through manual an mechanical means without complication.  Return appointment recommended at routine intervals of 3 months    Trudie Buckler, DPM

## 2013-08-15 ENCOUNTER — Encounter: Payer: Self-pay | Admitting: Internal Medicine

## 2013-08-26 ENCOUNTER — Ambulatory Visit (INDEPENDENT_AMBULATORY_CARE_PROVIDER_SITE_OTHER): Payer: Medicare Other | Admitting: Internal Medicine

## 2013-08-26 ENCOUNTER — Encounter: Payer: Self-pay | Admitting: Internal Medicine

## 2013-08-26 VITALS — BP 112/60 | HR 95 | Wt 204.0 lb

## 2013-08-26 DIAGNOSIS — S7002XA Contusion of left hip, initial encounter: Secondary | ICD-10-CM

## 2013-08-26 DIAGNOSIS — N400 Enlarged prostate without lower urinary tract symptoms: Secondary | ICD-10-CM

## 2013-08-26 DIAGNOSIS — S7000XA Contusion of unspecified hip, initial encounter: Secondary | ICD-10-CM

## 2013-08-26 DIAGNOSIS — C801 Malignant (primary) neoplasm, unspecified: Secondary | ICD-10-CM

## 2013-08-26 DIAGNOSIS — C459 Mesothelioma, unspecified: Secondary | ICD-10-CM | POA: Insufficient documentation

## 2013-08-26 NOTE — Assessment & Plan Note (Signed)
He has decided against active Rx Will consult hospice

## 2013-08-26 NOTE — Assessment & Plan Note (Signed)
Fell on that hip No signs of serious illness Okay for him to continue the ibuprofen

## 2013-08-26 NOTE — Progress Notes (Signed)
Subjective:    Patient ID: [name], male    DOB: 17-Oct-1922, 80 y.o.   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 No bone masses. Has decided against any active Rx  Yesterday he fell About 2AM he woke to what he thought was someone ringing the doorbell Thought it might be his son coming in late [place] while putting robe on---and lost his balance [place] directly on his left side Checked by nurse then helped up by security Felt sore---now just more discomfort at tip of left hip Still can walk--but has pain Has been using some ibuprofen  Was having some strong bladder spasms since TURP Not sure if the trospium is helping  Having SOB---feels like he can't get a deep breath Oxygen sats are fine though No fever Moderate dry cough Needs to stop multiple times walking across home (uses walker)  Spending much of the day in bed Still able to be independent with dressing and bathroom Not enough energy for shower  Did have trial of morphine from [place] doctor Didn't help the breathing  Current Outpatient Prescriptions on File Prior to Visit  Medication Sig Dispense Refill  . amoxicillin (AMOXIL) 500 MG capsule Take 2,000 mg by mouth once. Before dental appts      . aspirin 81 MG tablet Take 81 mg by mouth daily.        Marland Kitchen aspirin-acetaminophen-caffeine (EXCEDRIN MIGRAINE) 250-250-65 MG per tablet Take 2 tablets by mouth as needed for pain.      . butalbital-acetaminophen-caffeine (FIORICET, ESGIC) 50-325-40 MG per tablet TAKE 1 - 2 TABLETS BY MOUTH EVERY 6 HOURS AS NEEDED FOR HEADACHE.  30 tablet  0  . colchicine 0.6 MG tablet Take 1 tablet (0.6 mg total) by mouth 2 (two) times daily as needed.  60 tablet  1  . Cyanocobalamin (VITAMIN B-12) 5000 MCG SUBL Place under the tongue.      . donepezil (ARICEPT) 10 MG tablet TAKE 1 TABLET AT BEDTIME  90 tablet  0  . fish oil-omega-3 fatty acids 1000 MG capsule  Take 1 g by mouth daily.        . folic acid (FOLVITE) 253 MCG tablet Take 1 tablet (800 mcg total) by mouth daily.      . Multiple Vitamin (MULTIVITAMIN) tablet Take 1 tablet by mouth daily.        . propranolol ER (INDERAL LA) 60 MG 24 hr capsule Take 1 capsule (60 mg total) by mouth daily.  90 capsule  3  . psyllium (METAMUCIL) 58.6 % powder Take 1 packet by mouth daily.        . tamsulosin (FLOMAX) 0.4 MG CAPS capsule Take 0.4 mg by mouth daily.       Marland Kitchen triamcinolone cream (KENALOG) 0.1 % Apply 1 application topically 2 (two) times daily as needed.  30 g  0  . Trospium Chloride 60 MG CP24 Take 1 capsule by mouth daily before breakfast.        No current facility-administered medications on file prior to visit.    Allergies  Allergen Reactions  . Bactrim [Sulfamethoxazole-Tmp Ds] Other (See Comments)    AFFECTED BLADDER  . Cephalexin     REACTION: intestinal distress    Past Medical History  Diagnosis Date  . Mild cognitive impairment   . Neuropathy   . History of prostate cancer     lupron/casodex x6 months then  RT  . BPH (benign prostatic hypertrophy)   . OSA (obstructive sleep apnea)     CPAP 13  . OA (osteoarthritis)   . SVT (supraventricular tachycardia)   . History of nephrolithiasis   . Diverticulosis of colon   . Urinary incontinence     med/briefs  . Bladder spasms   . Dermatophytosis of nail [id number]  . Gout   . Mesothelioma 2015    Stage 3    Past Surgical History  Procedure Laterality Date  . Tonsillectomy    . Transurethral resection of prostate  1990  . Svt ablation  1990  . Total knee arthroplasty  ~2007    Left  . Eyelid reduction  2002/2006  . Cataract extraction      OU then laser Rx to both eyes  . Transurethral resection of prostate  10/14    [facility]    Family History  Problem Relation Age of Onset  . Cancer Mother     breast  . Nephritis Father   . Crohn's disease Brother   . Diabetes Son   . Cancer Sister   . Coronary artery  disease Neg Hx   . Hypertension Neg Hx   . Colon cancer Neg Hx     History   Social History  . Marital Status: Widowed    Spouse Name: N/A    Number of Children: 2  . Years of Education: N/A   Occupational History  . retired     Financial controller; PhD in applied leadership/organizations   Social History Main Topics  . Smoking status: Former Smoker    Types: Cigarettes    Quit date: 02/04/1966  . Smokeless tobacco: Never Used  . Alcohol Use: Yes     Comment: occasional  . Drug Use: No  . Sexual Activity: Not on file   Other Topics Concern  . Not on file   Social History Narrative   Nephew Grover Canavan is his health care POA--lives in [place].    Has living will.    Requests DNR very strongly.    Would not accept tube feedings            Review of Systems Eating okay Weight down just slightly Still gets headache at times. Will awake in sweat later on (discussed avoiding the caffeine containing meds at night) Having constipation--taking double dose of metamucil and 2 senna daily     Objective:   Physical Exam  Constitutional: He appears well-developed. No distress.  Neck: Normal range of motion. Neck supple.  Cardiovascular: Normal rate.   Slightly irregular  Pulmonary/Chest: Effort normal and breath sounds normal. No respiratory distress. He has no wheezes. He has no rales.  Musculoskeletal:  Passive ROM okay in the left hip Able to stand independently and walk some with mild assist  Lymphadenopathy:    He has no cervical adenopathy.  Psychiatric: He has a normal mood and affect. His behavior is normal.          Assessment & Plan:

## 2013-08-26 NOTE — Assessment & Plan Note (Signed)
Had TURP and doing better Will try off the trospium in case that predisposed to fall

## 2013-08-26 NOTE — Progress Notes (Signed)
Pre visit review using our clinic review tool, if applicable. No additional management support is needed unless otherwise documented below in the visit note. 

## 2013-08-29 ENCOUNTER — Ambulatory Visit: Payer: Medicare Other | Admitting: Internal Medicine

## 2013-08-31 ENCOUNTER — Telehealth: Payer: Self-pay

## 2013-08-31 NOTE — Telephone Encounter (Signed)
Bonnie from Hospice at [facility] called and needs orders for congestion in chest; pt needs nebulizer (duo neb) and Robitussin for cough; needs orders to call in.  Pt has not been turned over to hospice doctor Horris Latino at [phone].  Ok per Dr. Maudie Mercury to give orders.  Horris Latino is aware.

## 2013-09-02 ENCOUNTER — Telehealth: Payer: Self-pay | Admitting: Internal Medicine

## 2013-09-02 NOTE — Telephone Encounter (Signed)
Phone call from [facility] with hospice Fever to 101.9 axillary Increased cough More tired---no longer getting out of bed at all Some dysuria  P: will send urine. Start empiric augmentin 875 bid for a week      I spoke to him also---- will plan home visit on Thursday the 8th--around noon

## 2013-09-04 ENCOUNTER — Telehealth: Payer: Self-pay | Admitting: Internal Medicine

## 2013-09-04 NOTE — Telephone Encounter (Signed)
Phone call from [facility] RN Having trouble sleeping---cough keeping him up Vomited with OTC cough meds  Okay to use lorazepam 0.5 tid prn  Will review further at visit tomorrow

## 2013-09-05 ENCOUNTER — Encounter: Payer: Self-pay | Admitting: Internal Medicine

## 2013-09-05 ENCOUNTER — Ambulatory Visit: Payer: Medicare Other | Admitting: Internal Medicine

## 2013-09-05 VITALS — BP 120/64 | HR 68 | Resp 18

## 2013-09-05 DIAGNOSIS — R222 Localized swelling, mass and lump, trunk: Secondary | ICD-10-CM

## 2013-09-05 DIAGNOSIS — C61 Malignant neoplasm of prostate: Secondary | ICD-10-CM

## 2013-09-05 DIAGNOSIS — C459 Mesothelioma, unspecified: Secondary | ICD-10-CM

## 2013-09-05 DIAGNOSIS — C801 Malignant (primary) neoplasm, unspecified: Secondary | ICD-10-CM

## 2013-09-05 DIAGNOSIS — R634 Abnormal weight loss: Secondary | ICD-10-CM

## 2013-09-05 DIAGNOSIS — G3184 Mild cognitive impairment, so stated: Secondary | ICD-10-CM

## 2013-09-05 DIAGNOSIS — N400 Enlarged prostate without lower urinary tract symptoms: Secondary | ICD-10-CM

## 2013-09-05 DIAGNOSIS — G4733 Obstructive sleep apnea (adult) (pediatric): Secondary | ICD-10-CM

## 2013-09-05 NOTE — Progress Notes (Signed)
Subjective:    Patient ID: [name], male    DOB: Sep 16, 1922, 80 y.o.   MRN: [id number]  HPI Stacy--hospice nurse is here Son and DIL also here  Had better night last night---slept 10 hours Used the lorazepam  Appetite is poor Maybe 1 good meal a day Nausea once with some cough and phlegm---otherwise not. Bowels are slow. He is confused--thought he went twice yesterday but didn't On senna-s 2 bid Will add miralax daily  Staying in bed over the past few days Got up to bedside commode with help of 2 --- 2 days ago No major pain---uncomfortable moving around in bed Does get the morphine occasionally for his shortness of breath Ongoing cough   He is at peace with this process Appreciates the help of hospice--family really feels supported  Current Outpatient Prescriptions on File Prior to Visit  Medication Sig Dispense Refill  . amoxicillin (AMOXIL) 500 MG capsule Take 2,000 mg by mouth once. Before dental appts      . aspirin 81 MG tablet Take 81 mg by mouth daily.        Marland Kitchen aspirin-acetaminophen-caffeine (EXCEDRIN MIGRAINE) 250-250-65 MG per tablet Take 2 tablets by mouth as needed for pain.      . butalbital-acetaminophen-caffeine (FIORICET, ESGIC) 50-325-40 MG per tablet TAKE 1 - 2 TABLETS BY MOUTH EVERY 6 HOURS AS NEEDED FOR HEADACHE.  30 tablet  0  . colchicine 0.6 MG tablet Take 1 tablet (0.6 mg total) by mouth 2 (two) times daily as needed.  60 tablet  1  . Cyanocobalamin (VITAMIN B-12) 5000 MCG SUBL Place under the tongue.      . donepezil (ARICEPT) 10 MG tablet TAKE 1 TABLET AT BEDTIME  90 tablet  0  . fish oil-omega-3 fatty acids 1000 MG capsule Take 1 g by mouth daily.        . folic acid (FOLVITE) 329 MCG tablet Take 1 tablet (800 mcg total) by mouth daily.      . Multiple Vitamin (MULTIVITAMIN) tablet Take 1 tablet by mouth daily.        . propranolol ER (INDERAL LA) 60 MG 24 hr capsule Take 1 capsule (60 mg total) by mouth daily.  90 capsule  3  . psyllium  (METAMUCIL) 58.6 % powder Take 1 packet by mouth daily.        . tamsulosin (FLOMAX) 0.4 MG CAPS capsule Take 0.4 mg by mouth daily.       Marland Kitchen triamcinolone cream (KENALOG) 0.1 % Apply 1 application topically 2 (two) times daily as needed.  30 g  0   No current facility-administered medications on file prior to visit.    Allergies  Allergen Reactions  . Bactrim [Sulfamethoxazole-Tmp Ds] Other (See Comments)    AFFECTED BLADDER  . Cephalexin     REACTION: intestinal distress    Past Medical History  Diagnosis Date  . Mild cognitive impairment   . Neuropathy   . History of prostate cancer     lupron/casodex x6 months then RT  . BPH (benign prostatic hypertrophy)   . OSA (obstructive sleep apnea)     CPAP 13  . OA (osteoarthritis)   . SVT (supraventricular tachycardia)   . History of nephrolithiasis   . Diverticulosis of colon   . Urinary incontinence     med/briefs  . Bladder spasms   . Dermatophytosis of nail [id number]  . Gout   . Mesothelioma 2015    Stage 3    Past Surgical  History  Procedure Laterality Date  . Tonsillectomy    . Transurethral resection of prostate  1990  . Svt ablation  1990  . Total knee arthroplasty  ~2007    Left  . Eyelid reduction  2002/2006  . Cataract extraction      OU then laser Rx to both eyes  . Transurethral resection of prostate  10/14    [facility]    Family History  Problem Relation Age of Onset  . Cancer Mother     breast  . Nephritis Father   . Crohn's disease Brother   . Diabetes Son   . Cancer Sister   . Coronary artery disease Neg Hx   . Hypertension Neg Hx   . Colon cancer Neg Hx     History   Social History  . Marital Status: Widowed    Spouse Name: N/A    Number of Children: 2  . Years of Education: N/A   Occupational History  . retired     Financial controller; PhD in applied leadership/organizations   Social History Main Topics  . Smoking status: Former Smoker    Types: Cigarettes    Quit date:  02/04/1966  . Smokeless tobacco: Never Used  . Alcohol Use: Yes     Comment: occasional  . Drug Use: No  . Sexual Activity: Not on file   Other Topics Concern  . Not on file   Social History Narrative   Nephew Grover Canavan is his health care POA--lives in [place].    Has living will.    Requests DNR very strongly.    Would not accept tube feedings            Review of Systems Foley placed  Did have brief hematuria---clear now Not depressed    Objective:   Physical Exam  Constitutional: He appears well-developed. No distress.  Neck: Normal range of motion. No thyromegaly present.  Cardiovascular: Normal rate, regular rhythm and normal heart sounds.  Exam reveals no gallop.   No murmur heard. Pulmonary/Chest: Effort normal. No respiratory distress. He has no wheezes.  Bibasilar crackles and some rhonchi  Abdominal: Soft. He exhibits no distension. There is no tenderness. There is no rebound and no guarding.  Musculoskeletal: He exhibits no edema and no tenderness.  Lymphadenopathy:    He has no cervical adenopathy.  Psychiatric:  Calm and at peace          Assessment & Plan:

## 2013-09-05 NOTE — Assessment & Plan Note (Signed)
Has been stable Will stop the donepezil

## 2013-09-05 NOTE — Assessment & Plan Note (Addendum)
Metastatic Hard to predict course but he has declined quickly He is at peace and not fighting death Discussed symptom relief---duoneb, morphine Son and wife will need at least some respite

## 2013-09-05 NOTE — Assessment & Plan Note (Signed)
Now bed bound with Foley Will stop the tamsulosin

## 2013-09-05 NOTE — Assessment & Plan Note (Signed)
Doesn't seem to be active 

## 2013-09-06 ENCOUNTER — Encounter: Payer: Self-pay | Admitting: Internal Medicine

## 2013-09-09 ENCOUNTER — Telehealth: Payer: Self-pay | Admitting: Internal Medicine

## 2013-09-09 MED ORDER — MORPHINE SULFATE (CONCENTRATE) 20 MG/ML PO SOLN: 5.0000 mg | ORAL | Status: DC | PRN

## 2013-09-09 NOTE — Telephone Encounter (Signed)
rx faxed to pharmacy manually [facility]

## 2013-09-09 NOTE — Telephone Encounter (Signed)
Phone call from [facility] RN Having trouble with constipation Tried enema yesterday without success No sig impaction felt low  Has started miralax---suggested she try bid till he clears  Also, needs his morphine refilled

## 2013-09-11 ENCOUNTER — Other Ambulatory Visit: Payer: Self-pay | Admitting: Internal Medicine

## 2013-09-13 ENCOUNTER — Other Ambulatory Visit: Payer: Self-pay | Admitting: Internal Medicine

## 2013-09-18 ENCOUNTER — Telehealth: Payer: Self-pay | Admitting: Internal Medicine

## 2013-09-18 NOTE — Telephone Encounter (Signed)
Phone call from [facility] RN Having more pain and spasms by Foley Overall doing better Will try 2% lidocaine gel topically every 2 hours prn

## 2013-09-24 ENCOUNTER — Other Ambulatory Visit: Payer: Self-pay | Admitting: Internal Medicine

## 2013-09-27 ENCOUNTER — Other Ambulatory Visit: Payer: Self-pay | Admitting: *Deleted

## 2013-09-27 MED ORDER — IPRATROPIUM-ALBUTEROL 0.5-2.5 (3) MG/3ML IN SOLN: 3.0000 mL | RESPIRATORY_TRACT | Status: AC | PRN

## 2013-09-27 MED ORDER — LORAZEPAM 0.5 MG PO TABS: 0.5000 mg | ORAL_TABLET | Freq: Three times a day (TID) | ORAL | Status: DC | PRN

## 2013-09-30 ENCOUNTER — Telehealth: Payer: Self-pay | Admitting: Internal Medicine

## 2013-09-30 NOTE — Telephone Encounter (Signed)
Call from [facility] RN He is having more trouble with bladder spasms Foley repositioning and flushing have not helped  Will try detrol 2mg  bid prn

## 2013-10-07 ENCOUNTER — Telehealth: Payer: Self-pay | Admitting: Internal Medicine

## 2013-10-07 NOTE — Telephone Encounter (Signed)
Phone call from [facility] nurse Having some confusion and hallucinations  Will stop the detrol May have helped spasm Will reevaluate based on how he does off the med

## 2013-10-09 ENCOUNTER — Telehealth: Payer: Self-pay | Admitting: Internal Medicine

## 2013-10-09 MED ORDER — MORPHINE SULFATE (CONCENTRATE) 20 MG/ML PO SOLN: 5.0000 mg | ORAL | Status: DC | PRN

## 2013-10-09 NOTE — Telephone Encounter (Signed)
Phone call from [facility] out and doing okay with this  Now with more tremor ?from compazine Will change to ondansetron as needed for nausea  Needs roxanol Rx  0.25-0.5cc q2hrs prn  Dee, Have Rollene Fare do refill and fax to his pharmacy  Noted on sticky note on my stand---can then discard that

## 2013-10-09 NOTE — Addendum Note (Signed)
Addended by: Despina Hidden on: 10/09/2013 03:09 PM   Modules accepted: Orders

## 2013-10-09 NOTE — Telephone Encounter (Signed)
RX signed and given to dee

## 2013-10-09 NOTE — Telephone Encounter (Signed)
Rollene Fare can you please sign this rx?

## 2013-10-22 ENCOUNTER — Other Ambulatory Visit: Payer: Self-pay | Admitting: Internal Medicine

## 2013-10-24 ENCOUNTER — Ambulatory Visit: Admitting: Internal Medicine

## 2013-10-24 ENCOUNTER — Encounter: Payer: Self-pay | Admitting: Internal Medicine

## 2013-10-24 VITALS — BP 112/58 | HR 102 | Resp 12

## 2013-10-24 DIAGNOSIS — C459 Mesothelioma, unspecified: Secondary | ICD-10-CM

## 2013-10-24 DIAGNOSIS — N400 Enlarged prostate without lower urinary tract symptoms: Secondary | ICD-10-CM

## 2013-10-24 DIAGNOSIS — C801 Malignant (primary) neoplasm, unspecified: Secondary | ICD-10-CM

## 2013-10-24 DIAGNOSIS — I498 Other specified cardiac arrhythmias: Secondary | ICD-10-CM

## 2013-10-24 DIAGNOSIS — R42 Dizziness and giddiness: Secondary | ICD-10-CM

## 2013-10-24 DIAGNOSIS — R1084 Generalized abdominal pain: Secondary | ICD-10-CM

## 2013-10-24 NOTE — Assessment & Plan Note (Signed)
Intermittent and highly variable location Abdomen benign Clearly seems to be referred pain from the mesothelioma (pleural or diaphragm involvement) Reassured---continue prn morphine

## 2013-10-24 NOTE — Assessment & Plan Note (Signed)
Having some mild symptoms Will try prn meclizine if worsens

## 2013-10-24 NOTE — Progress Notes (Signed)
Subjective:    Patient ID: [name], male    DOB: 1923/04/21, 80 y.o.   MRN: [id number]  HPI Home visit Dominic Mackie RN from hospice here [name] his son is also here  Just back from brief respite at hospice home (respite for son and his wife) for 5 days He hallucinated "the whole time" and is happy to be home Now with aides 16 hours per day--this is working out better  Catheter was taken out and he is voiding okay This has eliminated much of his spasm and discomfort  Having some severe mid stomach pain Seemed to move in location at times--and would be relieved at times Appetite is fair---mostly just 1 good meal at breakfast roxanol does control the pain Senna stopped---?some spasm adding to pain  Cough better with nebs Breathing has been okay  Current Outpatient Prescriptions on File Prior to Visit  Medication Sig Dispense Refill  . aspirin 81 MG tablet Take 81 mg by mouth daily.        Marland Kitchen ipratropium-albuterol (DUONEB) 0.5-2.5 (3) MG/3ML SOLN Inhale 3 mLs into the lungs every 4 (four) hours as needed (shortness of breath).  120 mL  5  . LORazepam (ATIVAN) 0.5 MG tablet Take 1 tablet (0.5 mg total) by mouth 3 (three) times daily as needed for anxiety. And 1 tablet at bedtime  120 tablet  0  . Melatonin 3 MG TABS TAKE 1-2 TABLETS BY MOUTH AT NIGHT AT BEDTIME AS NEEDED FOR SLEEP  30 tablet  0  . morphine (ROXANOL) 20 MG/ML concentrated solution Take 0.25-0.5 mLs (5-10 mg total) by mouth every 2 (two) hours as needed for severe pain.  30 mL  0  . ondansetron (ZOFRAN) 4 MG tablet Take 4 mg by mouth 3 (three) times daily as needed for nausea or vomiting.      . polyethylene glycol (MIRALAX / GLYCOLAX) packet USE 1 PACKET ONCE DAILY AS DIRECTED  15 packet  1  . propranolol ER (INDERAL LA) 60 MG 24 hr capsule TAKE 1 CAPSULE (60 MG TOTAL) BY MOUTH DAILY.  90 capsule  3  . triamcinolone cream (KENALOG) 0.1 % Apply 1 application topically 2 (two) times daily as needed.  30 g  0   No current  facility-administered medications on file prior to visit.    Allergies  Allergen Reactions  . Bactrim [Sulfamethoxazole-Tmp Ds] Other (See Comments)    AFFECTED BLADDER  . Cephalexin     REACTION: intestinal distress    Past Medical History  Diagnosis Date  . Mild cognitive impairment   . Neuropathy   . History of prostate cancer     lupron/casodex x6 months then RT  . BPH (benign prostatic hypertrophy)   . OSA (obstructive sleep apnea)     CPAP 13  . OA (osteoarthritis)   . SVT (supraventricular tachycardia)   . History of nephrolithiasis   . Diverticulosis of colon   . Urinary incontinence     med/briefs  . Bladder spasms   . Dermatophytosis of nail [id number]  . Gout   . Mesothelioma 2015    Stage 3    Past Surgical History  Procedure Laterality Date  . Tonsillectomy    . Transurethral resection of prostate  1990  . Svt ablation  1990  . Total knee arthroplasty  ~2007    Left  . Eyelid reduction  2002/2006  . Cataract extraction      OU then laser Rx to both eyes  . Transurethral resection  of prostate  10/14    [facility]    Family History  Problem Relation Age of Onset  . Cancer Mother     breast  . Nephritis Father   . Crohn's disease Brother   . Diabetes Son   . Cancer Sister   . Coronary artery disease Neg Hx   . Hypertension Neg Hx   . Colon cancer Neg Hx     History   Social History  . Marital Status: Widowed    Spouse Name: N/A    Number of Children: 2  . Years of Education: N/A   Occupational History  . retired     Financial controller; PhD in applied leadership/organizations   Social History Main Topics  . Smoking status: Former Smoker    Types: Cigarettes    Quit date: 02/04/1966  . Smokeless tobacco: Never Used  . Alcohol Use: Yes     Comment: occasional  . Drug Use: No  . Sexual Activity: Not on file   Other Topics Concern  . Not on file   Social History Narrative   Nephew Grover Canavan is his health care  POA--lives in [place].    Has living will.    Requests DNR very strongly.    Would not accept tube feedings            Review of Systems Bowels have been okay Sleeping well--but does get up in middle of night tossing and turning and wants to talk. Sleeps more in day at times. Mood has been okay. Accepts his diagnosis and the prognosis. No depression.     Objective:   Physical Exam  Constitutional: He appears well-developed and well-nourished. No distress.  Neck: Normal range of motion. No thyromegaly present.  Cardiovascular: Regular rhythm.   Slightly fast and occasional skip beats Gr 2/6 mitral systolic murmur  Pulmonary/Chest: Effort normal. No respiratory distress. He has no wheezes.  Slightly decreased breath sounds with scant dry bibasilar crackles  Abdominal: Soft. Bowel sounds are normal. He exhibits no distension and no mass. There is no tenderness. There is no rebound and no guarding.  Musculoskeletal: He exhibits no edema and no tenderness.  Lymphadenopathy:    He has no cervical adenopathy.  Skin:  Callous without inflammation on plantar left foot  Psychiatric: He has a normal mood and affect. His behavior is normal.          Assessment & Plan:

## 2013-10-24 NOTE — Assessment & Plan Note (Signed)
Voiding okay without the catheter now Much more comfortable

## 2013-10-24 NOTE — Assessment & Plan Note (Signed)
Mild tachycardia but no evidence of recurrence even off the beta blocker

## 2013-10-24 NOTE — Assessment & Plan Note (Signed)
Stage 4 On hospice care Now with increased home aides---didn't do well with respite at [place] is okay

## 2013-10-31 ENCOUNTER — Other Ambulatory Visit: Payer: Self-pay

## 2013-10-31 MED ORDER — MORPHINE SULFATE (CONCENTRATE) 20 MG/ML PO SOLN: 5.0000 mg | ORAL | Status: DC | PRN

## 2013-10-31 NOTE — Telephone Encounter (Signed)
rx faxed to pharmacy manually  

## 2013-10-31 NOTE — Telephone Encounter (Signed)
Stacy with Hospice left v/m requesting refill of morphine to [facility] today (hospice pt). Stacy request cb.

## 2013-11-07 ENCOUNTER — Ambulatory Visit: Payer: Medicare Other | Admitting: Podiatrist

## 2013-11-08 ENCOUNTER — Other Ambulatory Visit: Payer: Self-pay | Admitting: *Deleted

## 2013-11-08 MED ORDER — MORPHINE SULFATE (CONCENTRATE) 20 MG/ML PO SOLN: 20.0000 mg | ORAL | Status: DC | PRN

## 2013-11-18 ENCOUNTER — Other Ambulatory Visit: Payer: Self-pay

## 2013-11-18 MED ORDER — MORPHINE SULFATE (CONCENTRATE) 20 MG/ML PO SOLN: 10.0000 mg | ORAL | Status: AC | PRN

## 2013-11-18 NOTE — Telephone Encounter (Signed)
Spoke with hospice nurse and advised results

## 2013-11-18 NOTE — Telephone Encounter (Signed)
Stacy nurse with [facility] left v/m requesting rx morphine to be faxed to [place] with instructions 0.5 ml - 1 ml by mouth every hour as needed for severe pain. Med list has 1 ml by mouth every hour as needed for severe pain. Pt has enough med to last thru the night.Please advise.

## 2013-11-21 ENCOUNTER — Encounter: Payer: Medicare Other | Admitting: Internal Medicine

## 2013-11-30 DEATH — deceased

## 2014-04-30 ENCOUNTER — Encounter: Payer: Medicare Other | Admitting: Internal Medicine

## 2014-08-22 NOTE — Consult Note (Signed)
Chief Complaint:  Subjective/Chief Complaint Urine clearing with CBI. No more bladder spasms   Brief Assessment:  GEN obese   Gastrointestinal Normal  Bladder not distended   Gastrointestinal details normal Soft  Nontender  Nondistended  No masses palpable   Additional Physical Exam No clots in tubing   Assessment/Plan:  Invasive Device Daily Assessment of Necessity:  Does the patient currently have any of the following indwelling devices? foley   Indwelling Urinary Catheter continued, requirement due to   Reason to continue Indwelling Urinary Catheter inability to void as documented by bladder scanning  bladder irrigation is required   Assessment/Plan:  Assessment 1. Clot retention 2. CaP S/P EBRT   Plan Wean off CBI Home with foley/leg bag. Cysto in office   Electronic Signatures for Addendum Section:  Royston Cowper (MD) (Signed Addendum 08-Jul-14 12:40)  PSA elevated. Most likely has recurrent prostate cancer. Will folow this in the office as outpatient.   Electronic Signatures: Royston Cowper (MD)  (Signed 08-Jul-14 12:36)  Authored: Chief Complaint, Brief Assessment, Assessment/Plan   Last Updated: 08-Jul-14 12:40 by Royston Cowper (MD)

## 2014-08-22 NOTE — Discharge Summary (Signed)
PATIENT NAMEROMAIN, [name] MR#:  [id number] DATE OF BIRTH:  [birth year]  DATE OF ADMISSION:  11/12/2012 DATE OF DISCHARGE:  11/12/2012  (The patient is being urgently transferred to [facility] for further treatment and evaluation.)   DISCHARGE DIAGNOSES:  1.  Gross hematuria, recurrent.  2.  History of prostate cancer.  3.  Acute blood loss anemia secondary to the gross hematuria.  4.  History of migraines. 5.  History of hypertension.  6.  History of gout.  7.  Dementia.   DIET:  The patient is being discharged on a low-sodium diet.   ACTIVITY: As tolerated.   CURRENT MEDICATIONS: As follows: Tylenol 650 q. 4 hours as needed, Fioricet 2 tabs q. 4 hours as needed for headache, vitamin C 1000 mg daily, cetirizine 10 mg daily as needed for allergies, colchicine 0.6 mg daily as needed for gout, Aricept 10 mg at bedtime, vitamin B12 5000 mcg every weekly on Mondays, iron sulfate 623 mg b.i.d., folic acid 0.8 mg daily, multivitamin daily, omega-3 fatty acids 1 tab daily, Zofran 4 mg q. 6 hours as needed for nausea and vomiting, propranolol 60 mg daily and Flomax 0.4 mg daily.   [place] COURSE: Dr. Maryan Puls from Urology.   BRIEF HOSPITAL COURSE: This is an 81 year old male with medical problems as mentioned above, presented to the hospital on 11/12/2012 secondary to dizziness, lightheadedness and noted to have ongoing gross hematuria.   PROBLEM LIST: 1.  Hematuria:  The patient was recently admitted to the hospital about a week or so ago for a similar problem, underwent continuous bladder irrigation and was discharged with a 2-way Foley home. The patient when he got home became lightheaded and dizzy and came back to the hospital as his hemoglobin had dropped significantly since his previous hospitalization down to as low as 7.6, and he had symptomatic anemia. The patient has a history of prostate cancer with radiation therapy. A Urology consult was  obtained. The patient was seen by Dr. Yves Dill. The patient is currently getting continuous bladder irrigation but continues to have ongoing hematuria. He has been transfused a total of 2 units of packed red blood cells. His post transfusion hemoglobin after his 2nd unit is currently pending. Dr. Yves Dill, from Urology, wanted to start the patient on an alum solution which is used for vesicular bleeding, although we do not have that available in our pharmacy, and we were not able to obtain that.  The patient likely needs this alum irrigation to prevent further bleeding and ongoing hematuria. This therapy is available at [facility], therefore, he is currently being transferred there presently. The patient will continue his Flomax as stated and continue with a Urology consult when he gets to [facility].  2.  Anemia: This is likely acute on chronic anemia. This is acute blood loss anemia from the hematuria. The patient is symptomatic with his anemia as he presented with dizziness and lightheadedness and was noted to be hypotensive; again, as mentioned, the patient was transfused 1 unit of packed red blood cells. His hemoglobin only came up to 7.0, therefore, he is getting another unit of blood. His post transfusion hemoglobin likely needs to be followed. He will continue his current supplements as stated.  3.  History of migraines: The patient currently has no headache. He will continue his Fioricet as needed.  4.  Hypertension: The patient is hemodynamically stable. Continue his propranolol for now.  5.  History of gout:  The patient  has no acute gout attack.  He will continue his colchicine p.r.n.  6.  Dementia:  The patient will continue his Aricept as stated.  7.  Acute renal failure:  The acute renal failure is likely secondary to volume loss from ongoing hematuria. For now, we will continue blood transfusions and IV fluids, follow BUN and creatinine and urine output, renal dose his meds and avoid nephrotoxins for now.    The patient is being transferred to [facility] for further evaluation of his hematuria as appropriate therapies here are currently not available. Dr. Yves Dill did facilitate and help in transferring the patient to [facility], and I appreciate his help. The accepting physician name is Dr. Para March at  [place]: 40 minutes.  ____________________________ Belia Heman. Verdell Carmine, MD vjs:cb D: 11/12/2012 16:10:13 ET T: 11/12/2012 16:26:23 ET JOB#: [id number] cc: Belia Heman. Verdell Carmine, MD, <Dictator> Henreitta Leber MD ELECTRONICALLY SIGNED 11/12/2012 20:46

## 2014-08-22 NOTE — Discharge Summary (Signed)
PATIENT NAMEKAIROS, [name] MR#:  [id number] DATE OF BIRTH:  1923/04/08  DATE OF ADMISSION:  11/01/2012 DATE OF DISCHARGE:  11/09/2012  REASON FOR ADMISSION: Abdominal and flank pain.   DISCHARGE DIAGNOSES: 1.  Acute renal failure.  2.  Obstructive uropathy.  3.  Benign prostatic hypertrophy.  4.  Prostate cancer active.  5.  Hematuria.  6.  Urinary retention.  7.  Acute blood loss anemia due to hematuria.  8.  Hyperkalemia.  9.  History of migraine headaches.  10.  AV nodal re-entry ablation in 1995.  11.  History of urinary incontinence.  12.  Mild constipation.  13.  Osteoarthritis.   MEDICATIONS AT DISCHARGE: Donepezil 10 mg once a day at bedtime. Propanol 60 mg once a day. Colcrys 0.6 mg as needed for gout.  Fioricet 1 to 2 p.o. q. 4 hours as needed  for migraines. Cyclobenzaprine 10 mg 3 times a day. Orphenadrine 100 mg twice daily. Excedrin Extra Strength 2 tablets 6 times a day. Tylenol p.r.n. pain. Zyrtec 10 mg once a day, multivitamins once daily, fish oil 1000 mg once daily, vitamin C 1000 mg once daily, folic acid 0.8 mg once daily, complex vitamins once a day. Metamucil 10 grams every day. Vitamin B12 5000 mg once a day on Monday. Aspirin 81 mg daily, hold for two weeks until re-evaluated by Dr. Yves Dill.  Tamsulosin 0.4 mg twice daily, ferrous sulfate 325 mg twice a day for anemia.    FOLLOWUP: With Dr. Yves Dill in 5 to 7 days. Follow up with Viviana Simpler in two weeks.   IMPORTANT RESULTS: CT scan of the abdomen and pelvis showed distention of the urinary bladder. Moderate hydronephrosis bilaterally. No evidence of appendicitis. Calcified stone on t in the gallbladder.  No intrahepatic ductal dilation. No hepatosplenomegaly.   EKG: Sinus bradycardia.   Creatinine 4.14 on admission with a potassium of 6. GFR was around 12, BUN 54, glucose 92, total protein 6.1, white blood cells 8.5, hemoglobin 11.3, platelet count 164. T4 8.8, T3 103, PSA 6.3 elevated. Urinalysis negative for  signs of urinary infection. Hemoglobin at discharge 8.8.   HOSPITAL COURSE: [name] is a very nice 81 year old gentleman who has history of chronic migraines. He comes to the ER with a history of abdominal and flank pain. He has history of prostate cancer with radiation therapy in 1999 and urinary incontinence since then.  He was recently put on oxybutynin. Due to some incontinence and painful urination was prescribed some Bactrim for three days.   The patient continues to have discomfort. It got worse and subsequently followed up with Dr. Silvio Pate for skin lesions. They needed to be removed. One of them was infected, for which he was prescribed Bactrim again. On 10/24/2012, the patient started having significant abdominal pain with spasms and abdominal cramps, for which he was seen. He came to the Emergency Department and it was found that he had an enlarged bladder with 1400 mL of urine.    HOSPITAL PROBLEMS: 1.  Acute renal failure. The patient has dissolution of this problem right now, significantly improved with IV fluids and Foley catheter. His acute kidney injury was related to his postobstructive uropathy. We consulted Urology after the Foley catheter was placed and we were not able to get it out due to hematuria.   2.  Dr. Yves Dill saw the patient in the hospital and determined that his PSA was high for which he still has an active cancer. 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    3.  The patient was put on a three-way flush system until the urine clears up, but today his urine is starting to look clear after having severe hematuria and he is able to be discharged home. He has been put on Flomax to facilitate urine flow after the patient is discharged.   4.  Acute blood loss anemia. The patient came with a hemoglobin of 11.3, dropped down to 8.8. Still no clear indication for transfusion. The patient is stable hemodynamically,  asymptomatic and his hemoglobin is above 7 for which we will defer the transfusion. The patient will get one if necessary. For now, we have him on IV iron and oral iron.   5.  The patient seems to be okay without any issues like orthostatic hypotension or tachycardia. He is able to be discharged home as long as somebody could help him with irrigation of his Foley catheter.   6.  His other medical problems were stable as well during this hospitalization.   I spent about 45 minutes with this discharge summary.   ____________________________ Rosston Sink, MD rsg:dp D: 11/09/2012 13:25:19 ET T: 11/09/2012 15:07:51 ET JOB#: [id number]  cc: [place] Sink, MD, <Dictator> Otelia Limes. Yves Dill, MD Venia Carbon, MD  Roselie Awkward America Brown MD ELECTRONICALLY SIGNED 11/11/2012 13:27

## 2014-08-22 NOTE — Consult Note (Signed)
PATIENT NAMEGERREN, [name] MR#:  [id number] DATE OF BIRTH:  [birth year]  DATE OF CONSULTATION:  11/05/2012  REFERRING PHYSICIAN:  Sanchez-Guitterez.  CONSULTING PHYSICIAN:   Yves Dill.  REASON FOR CONSULTATION: Urinary retention.   HISTORY OF PRESENT ILLNESS: [name] is an 81 year old Caucasian male who  develop bladder distention and was found to have urinary retention. A catheter was placed and 1400 mL of urine obtained. Urine has been blood-tinged with clots today. Nursing staff have hand-irrigated the clots out today. He has a history of prostate cancer status post external beam radiation therapy, with adjuvant Lupron and Casodex in 1999. He has not had a DRE or PSA since 2009. He has had some problems with urinary incontinence recently, and was prescribed oxybutynin. He denies weight loss or loss of appetite. He denies prior episodes of hematuria.   ALLERGIES: THE PATIENT IS ALLERGIC TO KEFLEX.   CHRONIC HOME MEDICATIONS: Include Aspirin, Colcrys, vitamin B12, cyclobenzaprine, donepezil, Excedrin, Fioricet, fish oil, folic acid, ibuprofen, Metamucil, multivitamins, orphenadrine, oxybutynin, prednisone, propranolol, super B complex, Tylenol, vitamin C, Zyrtec, and vitamin D3.   PAST SURGICAL HISTORY: Previous surgical procedures included cardiac AV ablation, 1995.   SOCIAL HISTORY: The patient denied tobacco or alcohol use.   FAMILY HISTORY: Negative for prostate cancer.   PAST AND CURRENT MEDICAL CONDITIONS:  1.  Migraine headaches.  2.  AV node conduction problems, status post ablation, 1995.  3.  Urinary incontinence.  4.  Prostate cancer.   REVIEW OF SYSTEMS: The patient denied bone pain, weight loss or loss of appetite. He denied a history of urinary tract infections.   PHYSICAL EXAMINATION: Obese white male in no distress.  HEENT: Sclerae were clear. Pupils were equally round and reactive to light and accommodation.  NECK: Supple. No palpable cervical adenopathy.  LUNGS:  Clear to auscultation.  CARDIOVASCULAR: Regular rhythm, without audible murmurs.  ABDOMEN: Soft, nontender abdomen.  GENITOURINARY: Buried penis. Testes were atrophic, less than 10 mL in size each.  RECTAL: No palpable rectal masses. Prostate gland was 15 grams, flat, smooth and nontender,   CT scan report dated July 3rd indicated bilateral hydronephrosis, without renal masses or stones.   Pertinent laboratory studies included a BUN of 25, creatinine 1.45, July 7th.   IMPRESSIONS: 1.  Urinary retention.  2.  Hemorrhagic radiation cystitis, versus hematuria due to bladder distention.  3.  Prostate cancer, status post external beam radiotherapy with adjuvant hormone blockade, 1999.   SUGGESTIONS:  1.  DC 16-French Foley and replace with 24-French 3-way catheter and start CBI with saline until clear.  2.  Discontinue oxybutynin and avoid oxybutynin in the future as it can cause urinary retention.  3. Obtain PSA.  4.  The patient will require indwelling Foley for at least 1 week because of the significantly distended bladder.  5.  Follow up in the office after discharge.     ____________________________ Otelia Limes. Yves Dill, MD [id number] D: 11/05/2012 12:59:17 ET T: 11/05/2012 14:17:50 ET JOB#: [id number]  cc: Otelia Limes. Yves Dill, MD, <Dictator> Royston Cowper MD ELECTRONICALLY SIGNED 11/06/2012 11:33

## 2014-08-22 NOTE — H&P (Signed)
PATIENT NAMEAHMARI, [name] MR#:  [id number] DATE OF BIRTH:  January 18, 1923  DATE OF ADMISSION:  11/12/2012  PRIMARY CARE PHYSICIAN:  Viviana Simpler, MD  UROLOGIST:  Clancy Gourd, MD  CHIEF COMPLAINT: Hematuria.  HISTORY OF PRESENT ILLNESS: The patient is a 81 year old Caucasian male with past medical history of prostate cancer, active, status post radiation therapy, past medical history of acute blood loss anemia due to hematuria and recently admitted on July 3rd with acute renal failure and discharged home with Foley catheter and leg bag, as recommended by urology. The patient's hematuria was resolved until yesterday morning when he restarted having gross hematuria.  As he was passing clots and Foley catheter was blocked, the patient's son who also is his caregiver flushed his Foley catheter x 2 following which the blockage was cleared, but still the patient was having gross hematuria and, subsequently, the patient started feeling dizzy. Son has brought him into the ER for gross hematuria and for feeling dizzy. Here in the ER, his renal function is abnormal with a creatinine of 1.55, and the patient's hemoglobin is at 7.6, which was at 8.8 on July 11th. The patient's hematocrit is 22.1. The ER physician has called on-call urologist, Dr. Sharlette Dense, who is covering for Dr. Yves Dill, who has recommended to admit the patient for continuous bladder irrigation. Typing and cross-matching is done and 1 unit of blood transfusion is ordered by the ER physician. During my examination, the patient denies any chest pain, shortness of breath or abdominal pain. Son is at bedside. No other complaints.   PAST MEDICAL HISTORY: 1.  Obstructive uropathy which is causing acute renal insufficiency. 2.  Benign prostatic hypertrophy. 3.  Prostate cancer, active, status post radiation therapy. 4.  Recent history of hematuria.  5.  Acute blood loss anemia due to hematuria. 6.  History of migraine headaches. 7.  AV nodal re-entry  ablation in 1995. 8.  Urinary incontinence. 9.  Osteoarthritis.  PAST SURGICAL HISTORY:  1.   Radiation therapy for prostate cancer.   ALLERGIES: BACTRIM, KEFLEX, SULFA DRUGS.  PSYCHOSOCIAL HISTORY: Lives at home with son. The patient used to smoke, but quit smoking several years ago, approximately 50 years ago. Denies alcohol or illicit drug usage.   FAMILY HISTORY: Mother had breast cancer. Grand-mom had breast cancer.   HOME MEDICATIONS:  1.  Zyrtec 10 mg once daily. 2.  Vitamin C 1000 mg once daily. 3.  Tylenol 325 mg 2 tablets q. 4 hours as needed. 4.  Tamsulosin 0.5 mg 2 times a day. 5. Aspirin 81 mg once daily.  6.  Multivitamin 1 tablet once daily. 7.  Folic acid 0.8 mg once daily. 8.  Fish oil 1000 mg once daily.  9.  Fioricet 325 mg/50 mg 2 tablets p.o. q. 4 hours.  10.  Iron sulfate 325 mg 2 times a day. 11.  Donepezil 10 mg once daily.  12.  Colcrys 0.6 mg 1 tablet p.o. once daily.    REVIEW OF SYSTEMS: CONSTITUTIONAL: Denies any fever, but complaining of weakness and dizziness. Denies any weight loss or weight gain.  EYES: Denies blurry vision, glaucoma.  ENT: Denies epistaxis, discharge, snoring.  RESPIRATORY:  No cough, COPD. CARDIOVASCULAR: Denies any chest pain, palpitations. Complaining of dizziness.  GASTROINTESTINAL: Denies nausea, vomiting, diarrhea, abdominal pain.  GENITOURINARY: No dysuria but gross hematuria is present. Has prostate cancer.  ENDOCRINE: Denies any polyuria, nocturia. HEMATOLOGIC AND LYMPHATIC: Acute on chronic anemia from blood loss.  No easy bruising.  INTEGUMENTARY:  No acne, rash, lesions.  MUSCULOSKELETAL: No joint pain in the neck, back, shoulder. Denies any swelling. NEUROLOGIC: Denies any history of CVA, TIA.  PSYCHIATRIC: Denies any ADD, OCD, bipolar disorder.   PHYSICAL EXAMINATION: VITAL SIGNS: Temperature 98.7, pulse 70, respirations 18, blood pressure 105/53, pulse ox 100%. GENERAL APPEARANCE: Not in acute distress.  Moderately built and obese.  HEENT: Normocephalic, atraumatic. Pupils are equal and reactive to light and accommodation. No scleral icterus. No conjunctival injection. No sinus tenderness. No postnasal drip.  NECK: Supple. No JVD. No thyromegaly. No lymphadenopathy.  LUNGS: Clear to auscultation bilaterally. No accessory muscle usage.  No anterior chest wall tenderness on palpation.  HEART:  S1 and S2 normal. Regular rate and rhythm. No murmurs.  ABDOMEN:  Soft, obese. Bowel sounds are positive in all 4 quadrants. Nontender, nondistended. No hepatosplenomegaly. NEUROLOGIC:  Awake, alert and oriented x 3. Cranial nerves II through XII are grossly intact. Motor and sensory are grossly intact. Reflexes are 2+.  EXTREMITIES: No edema. No cyanosis. No clubbing. GENITOURINARY: The patient has gross hematuria.  Intact Foley catheter with leg bag.  PSYCHIATRIC: Normal mood and affect.  MUSCULOSKELETAL: No joint effusion, tenderness or erythema.   LABORATORY AND DIAGNOSTICS: Urinalysis:  Bloody in appearance.  Nitrites and leukocyte esterase cannot be interpreted.   WBC 11.4, hemoglobin 11.6, hematocrit 22.1 and platelets 221.  Total protein 5.6, albumin 2.4, bili total 0.4. The rest of the LFTs are normal. Glucose 201, BUN 17, creatinine 1.55, sodium 139, potassium 3.5, chloride 107, CO2 25, anion gap 7, GFR 40, serum osmolality 285, calcium 8.0.   ASSESSMENT AND PLAN: An 81 year old Caucasian male presenting to the ER with the chief complaint of gross hematuria which started at 10:30 a.m. Subsequently, with dizziness, will be admitted with the following assessment and plan:  1.  Gross hematuria with history of prostate cancer status post radiation therapy. We will admit him to hospital, to med/surg unit. We will provide him 1 unit of blood transfusion and repeat hemoglobin and hematocrit. IV fluids will be provided. Continuous bladder irrigation as recommended by urology. Urology consult is placed to [facility].   We will do urine culture and sensitivity.  2.  Acute renal insufficiency with dizziness. We will provide him IV fluids.  3.  Anemia, which is acute blood loss anemia from gross hematuria. Will do blood transfusion, 1 unit, and then repeat hemoglobin and hematocrit.  4.  History of migraine headache.  Currently the patient is asymptomatic.  5.  We will provide him deep vein thrombosis prophylaxis with SCDs.  6.  He is DNR. Son is medical POA.   The diagnosis and plan of care was discussed in detail with the patient and son at bedside. They both verbalized understanding of the plan.   TOTAL TIME SPENT:  On admission including reviewing medical records and discussion with the patient and ER staff is 50 minutes. ____________________________ Nicholes Mango, MD ag:sb D: 11/12/2012 04:42:55 ET T: 11/12/2012 07:15:34 ET JOB#: [id number]  cc: Nicholes Mango, MD, <Dictator> Otelia Limes. Yves Dill, MD Nicholes Mango MD ELECTRONICALLY SIGNED 11/25/2012 6:33

## 2014-08-22 NOTE — H&P (Signed)
PATIENT NAMEJAICOB, [name] MR#:  [id number] DATE OF BIRTH:  [birth year]  DATE OF ADMISSION:  11/01/2012  REQUESTING PHYSICIAN: Latina Craver, MD    PRIMARY CARE PHYSICIAN: Viviana Simpler, MD   CHIEF COMPLAINT: Abdominal and flank pain.   HISTORY OF PRESENT ILLNESS: The patient is an 81 year old male with a known history of prostate cancer, status post radiation therapy in 1999, and has urinary incontinence since then, on oxybutynin, had some painful urination issue for which he went to [facility] in [place] who prescribed Bactrim for 3 days which induced his discomfort and relieved the problem.  Subsequently, he had a followup with Dr. Silvio Pate on 06/11, when he froze 2 tag moles off his neck. One of them became infected for which he required followup with Dr. Arnette Norris from [facility] on 06/20 who also prescribed Bactrim for about a week and the infection resolved.  The week after that on 06/25, he started having significant abdominal cramps and difficult urination, along with lower abdominal pain on the right side, along with losing urinary control completely for which he called Dr. Silvio Pate, who requested him to see Dr. Jacqlyn Larsen, for which he had a scheduled appointment on the coming Monday. He was having a lot of suprapubic soreness and sharp pain in the right flank for which he saw a nurse at [place] where he lives who requested him to come to the Emergency Department as he could have kidney stone. While in the ED, he was found to have acute renal failure with creatinine of 4.14 with hyperkalemia with a potassium of  6.0, and he is being admitted for further evaluation and management. He underwent CT scan of the abdomen and pelvis which showed distended urinary bladder. He got a Foley catheter placed and drained about 1400 mL of urine. He is feeling significantly better after that.   PAST MEDICAL HISTORY: 1.  Prostate cancer, status post radiation therapy in 1999.   2.  History of migraine headache.  3.  History of AV nodal re-entry mechanism, undergone ablation in 1995.  4.  History of urinary incontinence following prostate cancer treatment, on oxybutynin.   SOCIAL HISTORY: No smoking. No alcohol. He is widowed, lives at [place]. He is retired from [employer] business. Prior to that, he worked in [employer].   FAMILY HISTORY: Mother suffered from breast cancer.    ALLERGIES:  KEFLEX.   MEDICATIONS AT HOME: 1.  Amoxicillin 500 mg 4 capsules once as needed 1 hour prior to dental appointment. 2.  Aspirin 81 mg p.o. at bedtime.  3.  Colcrys 0.6 mg p.o. daily as needed.  4.  Cyanocobalamin 5000 mcg p.o. daily on Monday.  5.  Cyanocobalamin 1000 mcg p.o. daily except on Monday.  6.  Cyclobenzaprine 10 mg p.o. 3 times a day as needed.  7.  Donepezil 10 mg p.o. at bedtime.  8.  Excedrin Extra Strength 2 tablets p.o. every 6 hours as needed.  9.  Fioricet 2 tablets p.o. every 4 hours as needed.  10.  Fish oil 1000 mg p.o. daily.  11.  Folic acid 0.8 mg p.o. daily.  12.  Ibuprofen 200 mg p.o. every 4 hours as needed.  13.  Metamucil 10 mg p.o. every evening.  14.  Multivitamin once daily. 15.  Orphenadrine 100 mg p.o. b.i.d. as needed.  16.  Oxybutynin 5 mg p.o. daily.  17.  Prednisone 10 mg p.o. daily for swelling as needed.  18.  Propranolol 60 mg p.o. daily.  19.  Super B complex once daily.  20.  Tylenol 650 mg every 4 hours as needed.  21.  Vitamin C 1000 mg p.o. daily.  22.  Zyrtec 10 mg p.o. daily as needed.  23.  Vitamin D3 1000 international units once daily.   REVIEW OF SYSTEMS:  CONSTITUTIONAL: No fever. Positive fatigue and weakness.  EYES: No blurred or double vision.  ENT: No tinnitus or ear pain.  RESPIRATORY: No cough, wheezing or hemoptysis. CARDIOVASCULAR: No chest pain, orthopnea, edema.   GASTROINTESTINAL: No nausea, vomiting, diarrhea.  GENITOURINARY:  No dysuria or hematuria. Difficult urination and  urinary incontinence. ENDOCRINE: No polyuria or nocturia.  HEMATOLOGY: No anemia or easy bruising.  SKIN: No rash or lesion. He did have some mole which was treated by Dr. Silvio Pate 3 weeks ago and has been clear.  MUSCULOSKELETAL: No arthritis or muscle cramp.  NEUROLOGICAL: History of migraine.  No tingling, numbness, weakness.  PSYCHIATRIC: No history of anxiety or depression.   PHYSICAL EXAMINATION: VITAL SIGNS: Temperature 96.7,  heart rate 78 per minute, respirations 18 per minute, blood pressure 130/59 mmHg.  He is saturating 96% on room air.  GENERAL: The patient is an 81 year old obese male lying in the bed comfortably without any acute distress.  HEENT:  Eyes:  Pupils equal, round, reactive to light and accommodation. No scleral icterus. Extraocular muscles are intact.  Head:  Atraumatic, normocephalic. Oropharynx and nasopharynx:  Clear.   NECK:  Supple, no jugular venous distention.  No thyroid enlargement or tenderness. LUNGS: Clear to auscultation bilaterally. No rhonchi, rales or crepitation.  CARDIOVASCULAR: S1, S2 normal. No murmurs, rubs or gallops.  ABDOMEN: Soft, obese, has a minimal right lower quadrant tenderness. No guarding or rigidity. No organomegaly appreciated. Difficult exam due to his obesity.  MUSCULOSKELETAL: No joint swelling or effusion.  EXTREMITIES:  No pedal edema, cyanosis or clubbing.  NEUROLOGICAL: Cranial nerves II through XII intact. Muscle strength 5 out of 5 in all extremities.  Sensation intact.  PSYCHIATRIC:  Patient is alert and oriented x 3.  SKIN: No obvious rash, lesion, ulcer.   LABORATORY AND RADIOLOGICAL DATA:  Sodium 143, potassium 6.0, chloride 113, CO2 24, BUN 54, creatinine 4.14, blood sugar 92. Normal CBC except hemoglobin of 11.3, hematocrit 33.2, platelets 164. Negative UA.  CT scan of the abdomen and pelvis showed urinary bladder distention. Moderate hydronephrosis bilaterally with increased density in the periureteral fat and less  extent in the  perinephric fat on the right. Discreet obstructing mass or stone is not demonstrated. No evidence of acute appendicitis or acute bowel abnormality. Densely calcified stones within the contracted gallbladder present. No splenomegaly. Minimal atelectasis at the left lung base.  EKG showed sinus tachycardia with a right bundle branch block which is chronic.     IMPRESSION AND PLAN: 1.  Acute renal failure:  Likely postobstructive status post Foley, now voided about 1400 mL of urine.  This could also have some prerenal component.  We will hydrate with IV fluid, although he could also have significant damage from Bactrim, which is a nephrotoxin. We will consult Nephrology and start him on IV fluids and monitor his renal function.  2.  Hyperkalemia:  We will give Kayexalate. This should improve with IV fluid, also. We will consult Nephrology.  He does not have any EKG changes from hyperkalemia.  3.  Right flank and lower right quadrant pain:  No appendicitis. This could be from possible nephrolithiasis. Consider  urology consult as an outpatient unless may need inpatient consult also. 4.  Urinary incontinence:  We will oxybutynin.   CODE STATUS:  FULL CODE.    TOTAL TIME TAKING CARE OF THIS PATIENT: 55 minutes.  ____________________________ Lucina Mellow. Manuella Ghazi, MD vss:cb D: 11/01/2012 20:15:37 ET T: 11/01/2012 20:49:41 ET JOB#: [id number]  cc: Aubrey Voong S. Manuella Ghazi, MD, <Dictator> Venia Carbon, MD Lucina Mellow [facility] MD ELECTRONICALLY SIGNED 11/06/2012 10:45

## 2014-08-22 NOTE — Consult Note (Signed)
Chief Complaint:  Subjective/Chief Complaint Occasionally passing old clots, but no fresh bleeding.   VITAL SIGNS/ANCILLARY NOTES: **Vital Signs.:   11-Jul-14 04:00  Vital Signs Type Routine  Temperature Temperature (F) 98.4  Celsius 36.8  Temperature Source oral  Pulse Pulse 65  Respirations Respirations 20  Systolic BP Systolic BP 673  Diastolic BP (mmHg) Diastolic BP (mmHg) 61  Mean BP 80  Pulse Ox % Pulse Ox % 95  Pulse Ox Activity Level  At rest  Oxygen Delivery Room Air/ 21 %    08:16  Pulse Pulse 92  Respirations Respirations 20  Systolic BP Systolic BP 419  Diastolic BP (mmHg) Diastolic BP (mmHg) 77  Mean BP 94  *Intake and Output.:   11-Jul-14 01:20  Grand Totals Intake:   Output:  2025    Net:  -2025 24 Hr.:  2020  Continuous Bladder Irrigation ml     In:      Out:  2025    Net:  -2025    03:20  Grand Totals Intake:  3000 Output:  2500    Net:  500 24 Hr.:  3790  Continuous Bladder Irrigation ml     In:  3000    Out:  2500    Net:  500    05:13  Grand Totals Intake:  3000 Output:  3500    Net:  -500 24 Hr.:  2020  Continuous Bladder Irrigation ml     In:  3000    Out:  3500    Net:  -500    Shift 07:00  Grand Totals Intake:  6000 Output:  24097    Net:  -3532 99 Hr.:  2020  Continuous Bladder Irrigation ml     In:  6000    Out:  24268    Net:  -4525  Length of Stay Totals Intake:  341962 Output:  229798    Net:  -92119    Daily 07:00  Grand Totals Intake:  41740 Output:  17800    Net:  2020 24 Hr.:  2020  Oral Intake      In:  1820  Continuous Bladder Irrigation ml     In:  18000    Out:  17800    Net:  200  Length of Stay Totals Intake:  814481 Output:  856314    Net:  -97026    07:35  Grand Totals Intake:   Output:  1750    Net:  -3785 88 Hr.:  -1750  Continuous Bladder Irrigation ml     In:      Out:  1750    Net:  -1750    07:46  Grand Totals Intake:  3000 Output:      Net:  3000 24 Hr.:  1250  Continuous Bladder  Irrigation ml     In:  3000    Out:      Net:  3000    07:58  Grand Totals Intake:   Output:  1600    Net:  -1600 24 Hr.:  -350  Stool  Patient had a medium formed bowel movement.  Continuous Bladder Irrigation ml     In:      Out:  1600    Net:  -1600    Shift 15:00  Grand Totals Intake:  3000 Output:  3350    Net:  -350 24 Hr.:  -350  Continuous Bladder Irrigation ml     In:  3000    Out:  3350  Net:  -350  Length of Stay Totals Intake:  093267 Output:  176260    Net:  -33522   Brief Assessment:  Gastrointestinal details normal Soft  Nontender  Nondistended   Lab Results: Thyroid:  04-Jul-14 05:19   Thyroid Stimulating Hormone  0.353 (0.45-4.50 (International Unit)  ----------------------- Pregnant patients have  different reference  ranges for TSH:  - - - - - - - - - -  Pregnant, first trimetser:  0.36 - 2.50 uIU/mL)  Hepatic:  04-Jul-14 05:19   Bilirubin, Total 0.6  Alkaline Phosphatase 54  SGPT (ALT) 17  SGOT (AST) 15  Total Protein, Serum  6.1  Albumin, Serum  2.9  Oncology:  07-Jul-14 13:31   Prostate Specific Ag, Roche ECLIA  6.3 (Roche ECLIA methodology.                                                                      . According to the American Urological Association, Serum PSA should decrease and remain at undetectable levels after radical prostatectomy. The AUA defines biochemical recurrence as an initial PSA value 0.2 ng/mL or greater followed by a subsequent confirmatory PSA value 0.2 ng/mL or greater. Values obtained with different assay methods or kits cannot be used interchangeably. Results cannot beinterpreted as absolute evidence of the presence or absence of malignant disease.            [facility]            No: [id number]           [address], [place], [place] [address]           Lindon Romp, MD         [phone] Result(s) reported on 06 Nov 2012 at 02:51PM.)  General Ref:  04-Jul-14 07:47   T3 by  RIA ========== TEST NAME ==========  ========= RESULTS =========  = REFERENCE RANGE =  T3 (TRI-IODOTHYRONINE)  Triiodothyronine (T3) Triiodothyronine (T3)           [   103 ng/dL            ]            [address]               [facility]       No: [id number]           [address] [place], [place] [address]           Lindon Romp, MD         [phone]   Result(s) reported on 03 Nov 2012 at 02:47PM.  Thyroxine (T4) ========== TEST NAME ==========  ========= RESULTS =========  = REFERENCE RANGE =  THYROXINE(T4)  Thyroxine (T4) Thyroxine (T4)                  [   8.8 ug/dL            ]          4.5-12.0               [facility]            No: [id number]           [address], [place],  [place] [address]           Lindon Romp, MD         [phone]   Result(s) reported on 03 Nov 2012 at 02:47PM.  Cardiology:  03-Jul-14 17:51   Ventricular Rate 56  Atrial Rate 56  P-R Interval 172  QRS Duration 130  QT 470  QTc 453  P Axis 17  R Axis -8  T Axis 25  ECG interpretation Sinus bradycardia Right bundle branch block Abnormal ECG No previous ECGs available ----------unconfirmed---------- Confirmed by OVERREAD, NOT ([id number]), editor PEARSON, BARBARA ([id number]) on 11/03/2012 11:25:29 AM  Routine Chem:  03-Jul-14 17:01   Glucose, Serum 92  BUN  54  Creatinine (comp)  4.14  Sodium, Serum 143  Potassium, Serum  6.0  Chloride, Serum  113  CO2, Serum 24  Calcium (Total), Serum 9.5  Anion Gap  6  Osmolality (calc) 299  eGFR (African American)  14  eGFR (Non-African American)  12 (eGFR values <70m/min/1.73 m2 may be an indication of chronic kidney disease (CKD). Calculated eGFR is useful in patients with stable renal function. The eGFR calculation will not be reliable in acutely ill patients when serum creatinine is changing rapidly. It is not useful in  patients on dialysis. The eGFR calculation may not be applicable to patients at the low and  high extremes of body sizes, pregnant women, and vegetarians.)  04-Jul-14 05:19   Glucose, Serum 84  BUN  37  Creatinine (comp)  2.22  Sodium, Serum 144  Potassium, Serum 4.4  Chloride, Serum  113  CO2, Serum  20  Calcium (Total), Serum  8.4  Anion Gap 11  Osmolality (calc) 295  eGFR (African American)  30  eGFR (Non-African American)  26 (eGFR values <621mmin/1.73 m2 may be an indication of chronic kidney disease (CKD). Calculated eGFR is useful in patients with stable renal function. The eGFR calculation will not be reliable in acutely ill patients when serum creatinine is changing rapidly. It is not useful in  patients on dialysis. The eGFR calculation may not be applicable to patients at the low and high extremes of body sizes, pregnant women, and vegetarians.)  05-Jul-14 08:28   Glucose, Serum 91  BUN  23  Creatinine (comp)  1.42  Sodium, Serum 144  Potassium, Serum 4.3  Chloride, Serum  113  CO2, Serum 23  Calcium (Total), Serum 9.0  Anion Gap 8  Osmolality (calc) 290  eGFR (African American)  51  eGFR (Non-African American)  44 (eGFR values <6092min/1.73 m2 may be an indication of chronic kidney disease (CKD). Calculated eGFR is useful in patients with stable renal function. The eGFR calculation will not be reliable in acutely ill patients when serum creatinine is changing rapidly. It is not useful in  patients on dialysis. The eGFR calculation may not be applicable to patients at the low and high extremes of body sizes, pregnant women, and vegetarians.)  Result Comment POTASSIUM - Slight hemolysis, interpret results with  - caution.  Result(s) reported on 03 Nov 2012 at 08:54AM.  06-Jul-14 04:53   Glucose, Serum  101  BUN  19  Creatinine (comp)  1.41  Sodium, Serum 145  Potassium, Serum 3.8  Chloride, Serum  111  CO2, Serum 29  Calcium (Total), Serum 8.7  Anion Gap  5  Osmolality (calc) 291  eGFR (African American)  52  eGFR (Non-African American)   44 (eGFR values <51m54mn/1.73 m2 may be an indication of chronic kidney disease (CKD). Calculated  eGFR is useful in patients with stable renal function. The eGFR calculation will not be reliable in acutely ill patients when serum creatinine is changing rapidly. It is not useful in  patients on dialysis. The eGFR calculation may not be applicable to patients at the low and high extremes of body sizes, pregnant women, and vegetarians.)  07-Jul-14 04:54   Glucose, Serum  117  BUN  25  Creatinine (comp)  1.45  Sodium, Serum 143  Potassium, Serum 3.7  Chloride, Serum  109  CO2, Serum 27  Calcium (Total), Serum 8.5  Anion Gap 7  Osmolality (calc) 290  eGFR (African American)  50  eGFR (Non-African American)  43 (eGFR values <48m/min/1.73 m2 may be an indication of chronic kidney disease (CKD). Calculated eGFR is useful in patients with stable renal function. The eGFR calculation will not be reliable in acutely ill patients when serum creatinine is changing rapidly. It is not useful in  patients on dialysis. The eGFR calculation may not be applicable to patients at the low and high extremes of body sizes, pregnant women, and vegetarians.)  Iron Binding Capacity (TIBC)  229  Unbound Iron Binding Capacity 184  Iron, Serum  45  Iron Saturation 20 (Result(s) reported on 05 Nov 2012 at 07:37AM.)  Ferritin ([facility] 364 (Result(s) reported on 05 Nov 2012 at 07:40AM.)  10-Jul-14 05:30   Glucose, Serum  110  BUN  22  Creatinine (comp)  1.38  Sodium, Serum 142  Potassium, Serum 3.7  Chloride, Serum 107  CO2, Serum 28  Calcium (Total), Serum 9.1  Anion Gap 7  Osmolality (calc) 287  eGFR (African American)  53  eGFR (Non-African American)  46 (eGFR values <665mmin/1.73 m2 may be an indication of chronic kidney disease (CKD). Calculated eGFR is useful in patients with stable renal function. The eGFR calculation will not be reliable in acutely ill patients when serum creatinine is  changing rapidly. It is not useful in  patients on dialysis. The eGFR calculation may not be applicable to patients at the low and high extremes of body sizes, pregnant women, and vegetarians.)  11-Jul-14 05:45   Glucose, Serum  128  BUN  21  Creatinine (comp) 1.28  Sodium, Serum 140  Potassium, Serum 3.5  Chloride, Serum 104  CO2, Serum 27  Calcium (Total), Serum 9.0  Anion Gap 9  Osmolality (calc) 284  eGFR (African American)  58  eGFR (Non-African American)  50 (eGFR values <603min/1.73 m2 may be an indication of chronic kidney disease (CKD). Calculated eGFR is useful in patients with stable renal function. The eGFR calculation will not be reliable in acutely ill patients when serum creatinine is changing rapidly. It is not useful in  patients on dialysis. The eGFR calculation may not be applicable to patients at the low and high extremes of body sizes, pregnant women, and vegetarians.)  Routine UA:  03-Jul-14 17:01   Color (UA) Yellow  Clarity (UA) Clear  Glucose (UA) Negative  Bilirubin (UA) Negative  Ketones (UA) Negative  Specific Gravity (UA) 1.012  Blood (UA) Negative  pH (UA) 5.0  Protein (UA) Negative  Nitrite (UA) Negative  Leukocyte Esterase (UA) Negative (Result(s) reported on 01 Nov 2012 at 05:40PM.)  RBC (UA) 3 /HPF  WBC (UA) 2 /HPF  Bacteria (UA) TRACE  Epithelial Cells (UA) NONE SEEN  Mucous (UA) PRESENT (Result(s) reported on 01 Nov 2012 at 05:40PM.)  Routine Hem:  03-Jul-14 17:01   WBC (CBC) 8.5  RBC (CBC)  3.55  Hemoglobin (CBC)  11.3  Hematocrit (CBC)  33.2  Platelet Count (CBC) 164 (Result(s) reported on 01 Nov 2012 at 05:23PM.)  MCV 94  MCH 31.7  MCHC 33.9  RDW 14.1  04-Jul-14 05:19   WBC (CBC) 6.3  RBC (CBC)  2.92  Hemoglobin (CBC)  9.9  Hematocrit (CBC)  27.4  Platelet Count (CBC)  143  MCV 94  MCH 33.8  MCHC  36.1  RDW 13.7  Neutrophil % 71.1  Lymphocyte % 14.1  Monocyte % 11.2  Eosinophil % 3.0  Basophil % 0.6   Neutrophil # 4.5  Lymphocyte #  0.9  Monocyte # 0.7  Eosinophil # 0.2  Basophil # 0.0 (Result(s) reported on 02 Nov 2012 at 06:24AM.)  06-Jul-14 19:15   Hemoglobin (CBC)  10.8 (Result(s) reported on 04 Nov 2012 at 07:24PM.)  07-Jul-14 04:54   WBC (CBC) 8.7  RBC (CBC)  2.90  Hemoglobin (CBC)  9.4  Hematocrit (CBC)  26.6  Platelet Count (CBC) 165  MCV 92  MCH 32.2  MCHC 35.2  RDW 14.1  Neutrophil % 71.5  Lymphocyte % 12.7  Monocyte % 10.8  Eosinophil % 4.2  Basophil % 0.8  Neutrophil # 6.2  Lymphocyte # 1.1  Monocyte # 0.9  Eosinophil # 0.4  Basophil # 0.1 (Result(s) reported on 05 Nov 2012 at 05:42AM.)  10-Jul-14 05:30   WBC (CBC) 7.6  RBC (CBC)  2.77  Hemoglobin (CBC)  9.0  Hematocrit (CBC)  25.5  Platelet Count (CBC)  149  MCV 92  MCH 32.6  MCHC 35.3  RDW 14.1  Neutrophil % 69.9  Lymphocyte % 13.4  Monocyte % 9.6  Eosinophil % 6.2  Basophil % 0.9  Neutrophil # 5.3  Lymphocyte # 1.0  Monocyte # 0.7  Eosinophil # 0.5  Basophil # 0.1 (Result(s) reported on 08 Nov 2012 at 06:31AM.)  11-Jul-14 05:45   WBC (CBC) 9.0  RBC (CBC)  2.64  Hemoglobin (CBC)  8.8  Hematocrit (CBC)  24.3  Platelet Count (CBC) 169  MCV 92  MCH 33.3  MCHC  36.2  RDW 14.3  Neutrophil % 70.9  Lymphocyte % 12.4  Monocyte % 10.1  Eosinophil % 5.5  Basophil % 1.1  Neutrophil # 6.4  Lymphocyte # 1.1  Monocyte # 0.9  Eosinophil # 0.5  Basophil # 0.1 (Result(s) reported on 09 Nov 2012 at 06:26AM.)   Assessment/Plan:  Invasive Device Daily Assessment of Necessity:  Does the patient currently have any of the following indwelling devices? foley   Indwelling Urinary Catheter continued, requirement due to   Reason to continue Indwelling Urinary Catheter inability to void as documented by bladder scanning  bladder irrigation is required   Assessment/Plan:  Assessment Clot retention Prostate cancer-PSA recurrence s/p EBRT   Plan Will need to go home with catheter. Teach family to  irrigate prn clots. Follow-up in office to discuss treatment options for recurrent prostate cancer.   Electronic Signatures: Royston Cowper (MD)  (Signed 11-Jul-14 11:48)  Authored: Chief Complaint, VITAL SIGNS/ANCILLARY NOTES, Brief Assessment, Lab Results, Assessment/Plan   Last Updated: 11-Jul-14 11:48 by Royston Cowper (MD)

## 2014-08-22 NOTE — Consult Note (Signed)
Brief Consult Note: Diagnosis: Urinary retention. Hemorrhagic radiation cystitis. Prostate cancer.   Patient was seen by consultant.   Consult note dictated.   Recommend to proceed with surgery or procedure.   Recommend further assessment or treatment.   Orders entered.   Discussed with Attending MD.   Comments: Place 51 F 3-way cath and start CBI. Discontinue/avoid oxybutinin. Will need Foley for at least a week.  Electronic Signatures: Royston Cowper (MD)  (Signed 07-Jul-14 12:52)  Authored: Brief Consult Note   Last Updated: 07-Jul-14 12:52 by Royston Cowper (MD)

## 2014-08-22 NOTE — Consult Note (Signed)
PATIENT NAMEKEYTON, [name] MR#:  [id number] DATE OF BIRTH:  February 05, 1923  UROLOGY CONSULTATION REPORT  DATE OF CONSULTATION:  11/12/2012  REQUESTING PHYSICIAN:  Gouru.  CONSULTING PHYSICIAN:  Otelia Limes. Yves Dill, MD  REASON FOR CONSULTATION: Hematuria and prostate cancer.   HISTORY OF PRESENT ILLNESS: [name] is an 81 year old white male with a greater than 1-week history of intermittent hematuria. He was hospitalized last week and we eventually cleared his urine with continuous bladder irrigation. He became orthostatic last night, and this prompted an Emergency Room visit. He was noted to have a low hematocrit and was admitted for IV fluids and transfusion.   He is currently on CBI, and his urine is slightly blood-tinged. He only had 2 episodes of hematuria with clots over the weekend. He has a history of prostate cancer and is status post radiation therapy, with adjuvant Lupron and Casodex in 1999 at [facility]. Apparently he has not had any recent followup.   He was noted to have an elevated PSA of 6.3 ng/mL on July 7th. Recently he had been experienced frequency and urgency and was prescribed oxybutynin by his primary care physician. He developed acute urinary retention on July 7th which prompted catheterization, with a residual of 1400 mL. Hematuria occurred after draining the large bladder volume. It was felt that the hematuria could be related from a sudden decompression of an over-distended bladder versus radiation cystitis, versus possible prostate cancer recurrence or a new bladder tumor. We had suggested outpatient cystoscopy after his retention had resolved.   PAST MEDICAL HISTORY: THE PATIENT ALLERGIC TO KEFLEX.   CHRONIC HOME MEDICATIONS: Include aspirin, Colcrys, vitamin B12, cyclobenzaprine, donepezil, Excedrin, Fioricet, fish oil, folic acid, ibuprofen Metamucil, multivitamins, orphenadrine, oxybutynin, prednisone, propranolol, super B complex, Tylenol, vitamin  C, Zyrtec and vitamin D3.   Previous surgical procedures include cardiac ablation procedure for arrhythmia in 1995.   SOCIAL HISTORY: The patient denied tobacco or alcohol use.   FAMILY HISTORY: Noncontributory.   CURRENT MEDICAL CONDITIONS:  1.  Migraine headache.  2.  A-V node conduction problems, status post ablation 1995.  3. Prostate cancer, status post radiation therapy with adjuvant hormone blockade in 1999 at [facility].   REVIEW OF SYSTEMS: The patient denies weight loss or loss of appetite. He also denied bone pain.   EXAMINATION: Generally obese white male in no distress.  HEENT: Sclerae were clear.   NECK: Supple. No palpable cervical adenopathy.  ABDOMEN: Was soft. Bladder was not distended.  GU: Buried penis. Testes were atrophic. Foley catheter was in good position.  RECTAL: 15-gram flat, smooth nontender prostate.   CT scan report dated July 3rd indicated bilateral hydronephrosis without renal masses or stones.   PERTINENT LABORATORY STUDIES: Include a hematocrit of 22.1% and a creatinine of 1.55, July 14th.   IMPRESSIONS: 1.  Orthostasis, secondary to anemia, secondary to blood loss.  2.  Hematuria of undetermined etiology; possibly due to sudden decompression of the bladder.  3.  Recurrent prostate cancer, status post radiation therapy at [facility].   SUGGESTIONS: 1.  Start the patient on CBI with alum 50 grams in 5 liters, to run at 250 to 300 mL per hour.  2. Would maintain the alum irrigation until the bleeding has completely stopped, and then convert back to normal saline. Potentially we could remove his catheter on July the 17th. 3. Emergent cystoscopy could be performed if the bleeding persists, and we could consider fulguration at that time of  any significant bleeding areas in the bladder.  4. Would prefer to perform elective cystoscopy as an outpatient after catheters have been removed as the anatomy will be quite distorted due to  the retention and recent decompression as well as irrigation and catheter presence.  5.  Avoid Ditropan, oxybutynin, aspirin and NSAIDs.    ____________________________ Otelia Limes. Yves Dill, MD [id number] D: 11/12/2012 12:55:05 ET T: 11/12/2012 13:16:00 ET JOB#: [id number]  cc: Otelia Limes. Yves Dill, MD, <Dictator> Royston Cowper MD ELECTRONICALLY SIGNED 11/12/2012 17:35

## 2014-08-22 NOTE — Consult Note (Signed)
Brief Consult Note: Diagnosis: Hematuria. Prostate cancer.   Patient was seen by consultant.   Consult note dictated.   Recommend further assessment or treatment.   Orders entered.   Discussed with Attending MD.   Comments: Transfuse to Hct > 30. CBI with alum until clear. Catheter may be remove after 7/17. Cystoscopy will need to be scheduled as outpatient in the office. Cystoscopy could also be done emergently if bleeding persists and is severe.  Electronic Signatures: Royston Cowper (MD)  (Signed 14-Jul-14 12:09)  Authored: Brief Consult Note   Last Updated: 14-Jul-14 12:09 by Royston Cowper (MD)

## 2014-08-24 NOTE — Discharge Summary (Signed)
PATIENT NAMEHARBOR, [name] MR#:  [id number] DATE OF BIRTH:  1922/05/15  DATE OF ADMISSION:  06/11/2011 DATE OF DISCHARGE:  06/12/2011  DISCHARGE DIAGNOSIS:  1. Left foot cellulitis.  2. 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 3. Dementia.  4. Hypertension.   CONSULTATIONS: None.   DISCHARGE MEDICATIONS:  1. Propranolol 60 mg p.o. daily. 2. Aricept 10 mg p.o. daily.  3. Ditropan XL 5 mg p.o. daily.   NEW MEDICINE: Clindamycin 600 mg p.o. t.i.d. for 5 days.   HOSPITAL COURSE: The patient is an 81 year old male with pain in the left foot and tenderness that started one day before. The patient has no history of trauma. The patient has a past history of migraine headaches and also history of prostate cancer with urinary incontinence. The patient had radiation therapy for the prostate cancer. He was admitted for:  1. Left foot cellulitis: He was started on IV clindamycin because he is allergic to Keflex. The cellulitis has improved a lot, and ultrasound of the leg showed no deep vein thrombosis on the left side. The patient's redness has resolved, and he denies any pain and feels much better. He has been afebrile, and white count has been around 6.5, hemoglobin 12.7, hematocrit 36.9. The patient's WBC is not elevated even at admission also. The patient received clindamycin here in the hospital, 600 mg every eight hours for two days and discharging with 600 mg t.i.d. for five days to finish seven days. He can see his primary doctor, Dr. Silvio Pate, in one week.  2. History of B12 deficiency anemia and peripheral neuropathy: He  takes B12 injection every two weeks, which his B12 injection is due to tomorrow. We are going to give him B12 1000 mcg IM, and he can see his doctor as a follow up.  3. History of urinary incontinence: On oxybutynin, takes 5 mg and will continue that.  4. History of dementia: On Aricept, continue 10 mg. 5. History of migraine: On propranolol and continue the same dose.   6. Thrombocytopenia: Platelets are around 85 and no evidence of bleeding. The patient's thrombocytopenia needs to be evaluated with his primary doctor when he goes for a check-up.    DISCHARGE VITAL SIGNS: The patient's heart rate is 66 today, blood pressure 138/72, temperature 97.7, saturating around 97% on room air.   CONDITION ON DISCHARGE: Condition is stable. The patient will be going home.   TIME SPENT: More than 30 minutes.  ____________________________ Epifanio Lesches, MD sk:[id number] D: 06/12/2011 11:42:13 ET T: 06/12/2011 13:25:45 ET JOB#: [id number]  cc: Epifanio Lesches, MD, <Dictator> Venia Carbon, MD Epifanio Lesches MD ELECTRONICALLY SIGNED 06/20/2011 16:21

## 2014-08-24 NOTE — H&P (Signed)
PATIENT NAMESULLY, DYMENT MR#:  [id number] DATE OF BIRTH:  [birth year]  DATE OF ADMISSION:  06/11/2011  PRIMARY CARE PHYSICIAN: Viviana Simpler, MD.   CHIEF COMPLAINT: Left foot tenderness, swelling, and redness x24 hours.   HISTORY OF PRESENT ILLNESS: [name] is an 81 year old pleasant Caucasian male who was in his usual state of health until last 24 hours when he developed redness of the left foot area associated with tenderness. This had progressed to develop swelling and the redness had spread from the third toe up towards the ankle area. The patient came to the emergency department for evaluation and admitted for treatment for cellulitis. The patient does not recall any history of trauma to this area and has no prior cellulitis.   REVIEW OF SYSTEMS: CONSTITUTIONAL: The patient denies having any fever. No chills. No fatigue. EYES: Denies any blurring of vision. No double vision. ENT: No hearing impairment. No sore throat. No dysphagia. CARDIOVASCULAR: No chest pain. No shortness of breath. RESPIRATORY: No shortness of breath. No cough. No sputum production. GASTROINTESTINAL: No nausea, no vomiting, no abdominal pain. GENITOURINARY: No dysuria or frequency of urination. He has only occasional mild incontinence for urine. MUSCULOSKELETAL: No joint pain or swelling other than the left foot area. No muscular pain or tenderness or swelling. INTEGUMENTARY: He has rash in the form of cellulitis of the left foot. No ulcer formation. NEUROLOGY: No focal weakness. No seizure activity. No headache now but he has history of migraine headaches. PSYCHIATRY: No anxiety or depression.   PAST MEDICAL HISTORY:  1. History of AV nodal reentry mechanism, underwent ablation therapy in 1995.  2. History of migraine headaches for which he is taking propranolol or Inderal.  3. History of prostatic cancer status post radiation therapy 1999.  4. History of urinary incontinence following his prostatic cancer treatment.  This is now only mild.  5. History of left knee joint replacements.   SOCIAL HISTORY:  Social habits: Nonsmoker. Remote history of smoking. He quit in 1967. He drinks alcohol only socially. He drinks 1 to 2 drinks of wine over a weekend.   SOCIAL HISTORY: He is widowed. Lives at home alone. He is retired from working as a Public librarian. Prior to that he worked for [employer].   FAMILY HISTORY: His mother suffered from breast cancer. There is no family history of diabetes or hypertension.   ADMISSION MEDICATIONS:  1. Propranolol LA 60 mg once a day.   2. Oxybutynin or Ditropan 5 mg once a day.  3. Aricept 10 mg a day. The patient takes that for treatment of episodes of dissociation without loss of consciousness.   ALLERGIES: Reports Keflex, stating it will tear his abdomen resulting in severe diarrhea.   PHYSICAL EXAMINATION:  VITAL SIGNS: His blood pressure 117/60, respiratory rate 16, his pulse 82, temperature 95.4, oxygen saturation 97%.   GENERAL APPEARANCE: Elderly male lying in bed in no acute distress.   HEAD AND NECK EXAMINATION: Unremarkable.  No pallor, no icterus, no cyanosis.   NECK: Supple. Trachea at midline. No thyromegaly. No cervical lymphadenopathy.   HEART: Normal S1, S2. No S3, S4. No murmur. No gallop.   CHEST: Clear without rales. No wheezing.   ABDOMEN: Morbidly obese, soft without tenderness. No hepatosplenomegaly.   MUSCULOSKELETAL: No joint swelling. No clubbing.   NEUROLOGIC: Cranial nerves II through XII are intact. No focal motor deficit.   PSYCHIATRIC EVALUATION: The patient is alert and oriented x3. Mood and affect were normal.   LABORATORY FINDINGS:  His CBC showed white count 6000, hemoglobin 13, hematocrit 40, platelet count was low at 82,000. His serum glucose was 172, BUN 21, creatinine 0.9, sodium 143, potassium 4, calcium 9.1.   ASSESSMENT:  1. Left foot cellulitis associated with swelling or edema.  2. Elevated blood sugar indicating  underlying diabetes. We will check his hemoglobin A1c.   3. Thrombocytopenia, etiology is unclear. I would like to repeat the CBC tomorrow.  4. History of prostatic cancer.  5. History of AV nodal reentry, underwent ablation therapy in 1995.  6. His other medical problems include history of migraine headaches, history of mild urinary incontinence, history of left knee joint replacement.   PLAN: Admit the patient to the medical floor. Start IV antibiotics using clindamycin 600 mg q.8 hours. Hopefully this will not result in diarrhea. If this happens we may change that to quinolone. Left leg elevation. Obtain Doppler ultrasound of left leg to rule out underlying deep vein thrombosis. Check hemoglobin A1c since his blood sugar is elevated. For deep vein thrombosis prophylaxis I placed the patient on Lovenox 40 mg subcutaneous once a day. For peptic ulcer disease prophylaxis I placed him on Protonix 40 mg a day. We will monitor his CBC to check the platelets again tomorrow morning.     ____________________________ [name]. Lenore Manner, MD amd:vtd D: 06/11/2011 07:24:55 ET T: 06/11/2011 08:49:43 ET JOB#: [id number]  cc: [name]. Lenore Manner, MD, <Dictator> Venia Carbon, MD Mike Craze Irven Coe MD ELECTRONICALLY SIGNED 06/11/2011 22:43

## 2014-10-11 IMAGING — CT CT CHEST W/ CM
3 series · 17 of 46 positions shown, 20 images · IV contrast (isovue)
Comparison: None.

CLINICAL DATA: Prostate cancer.  Left lower lung mass.

EXAM:
CT CHEST WITH CONTRAST
TECHNIQUE: Multidetector CT imaging of the chest was performed during
intravenous contrast administration.
CONTRAST:  75 cc Isovue 370.

[Series 2: routine chest with · axial · 0.82mm/px · z∈[-350,-60]mm · 13 of 66 slices shown, 16 images]
[im 5/66  soft-tissue]
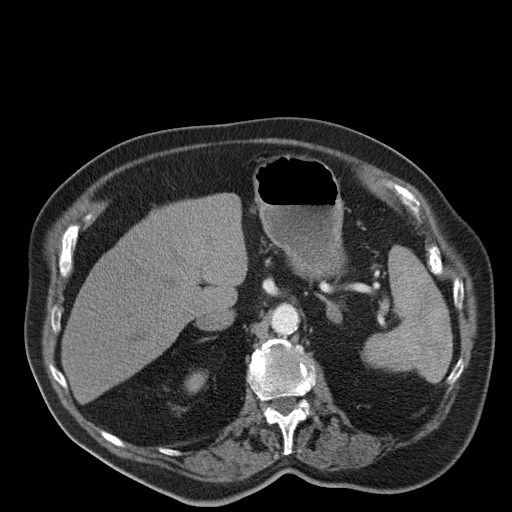
[im 5/66  bone]
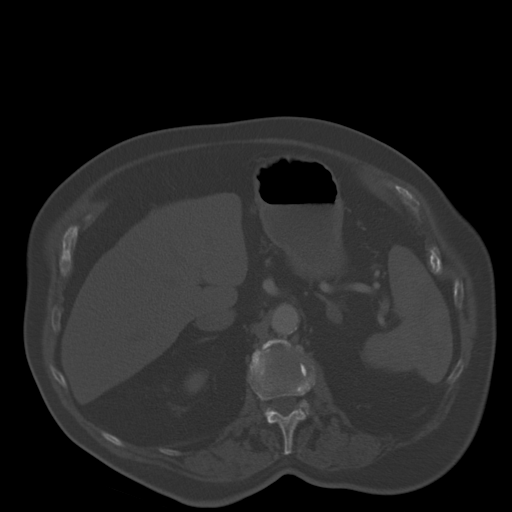
[im 11/66  soft-tissue]
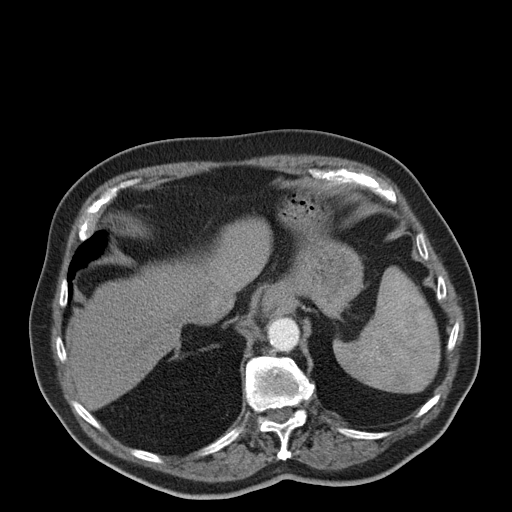
[im 17/66  soft-tissue]
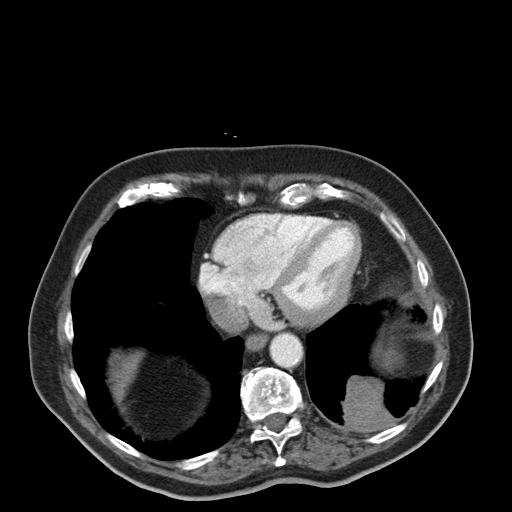
[im 24/66  soft-tissue]
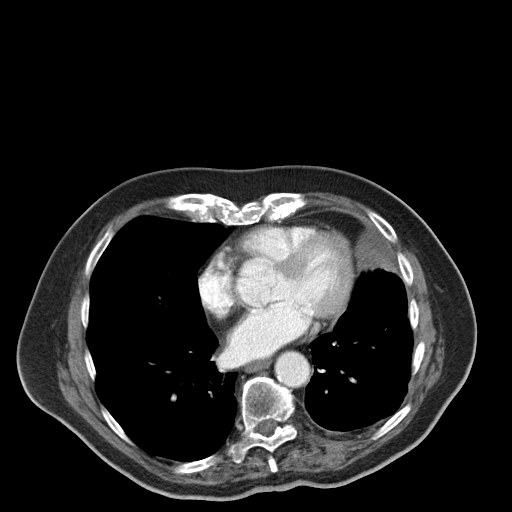
[im 30/66  soft-tissue]
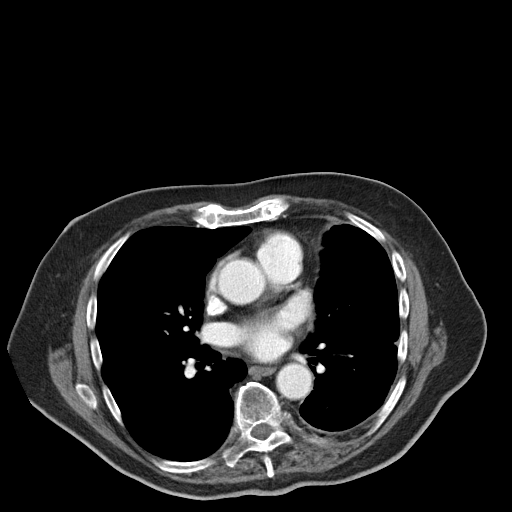
[im 36/66  soft-tissue]
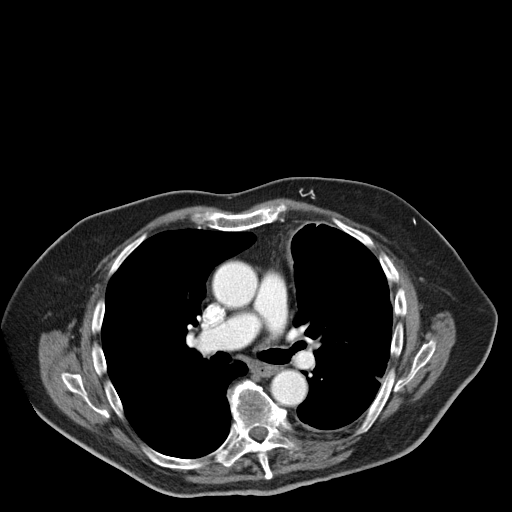
[im 42/66  soft-tissue]
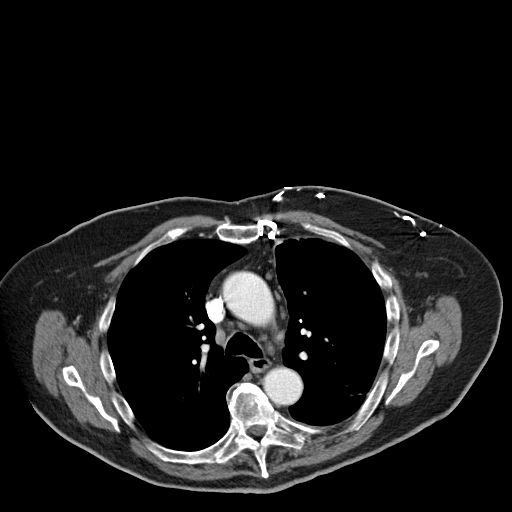
[im 49/66  soft-tissue]
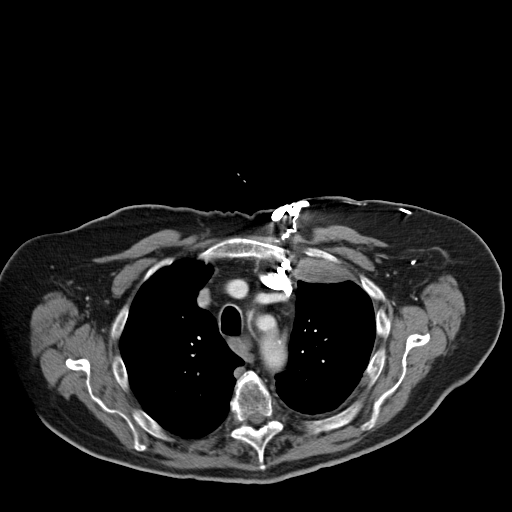
[im 55/66  soft-tissue]
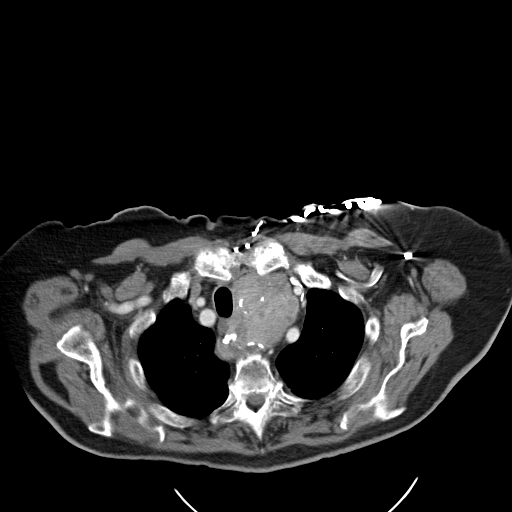
[im 55/66  bone]
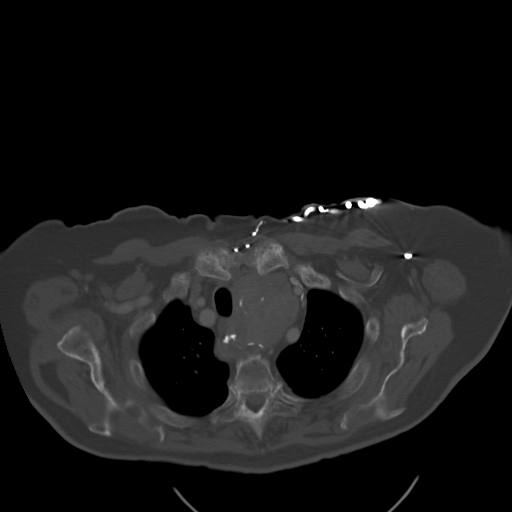
[im 57/66  lung]
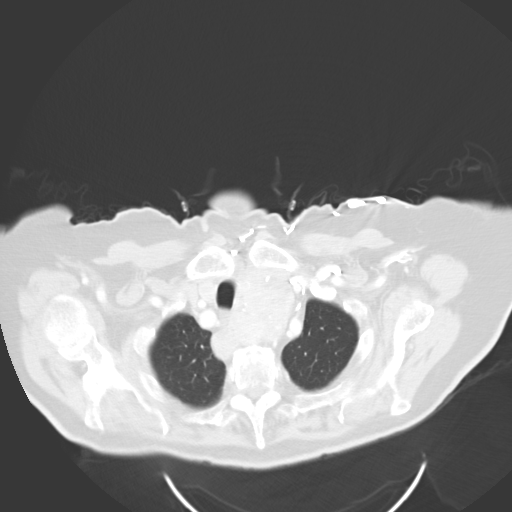
[im 59/66  lung]
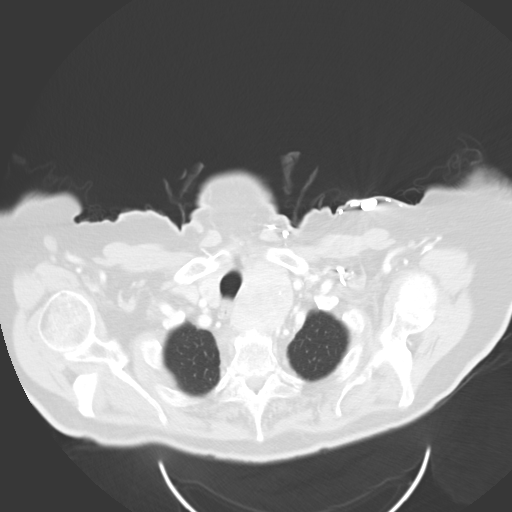
[im 61/66  soft-tissue]
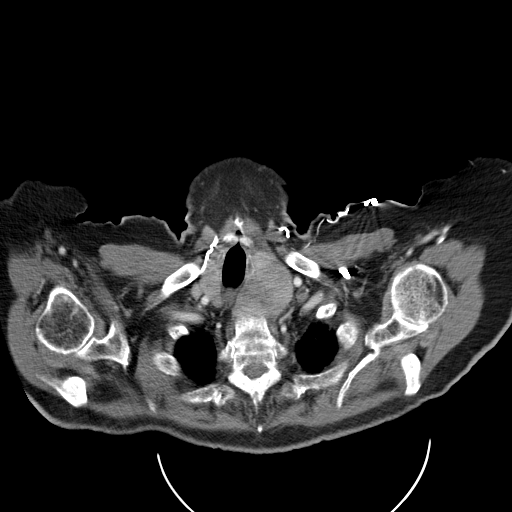
[im 61/66  lung]
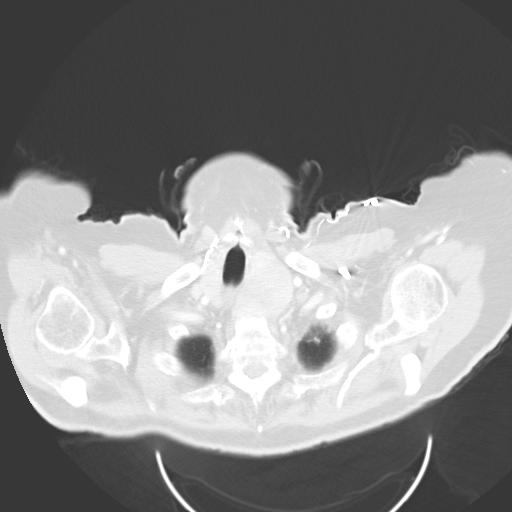
[im 63/66  lung]
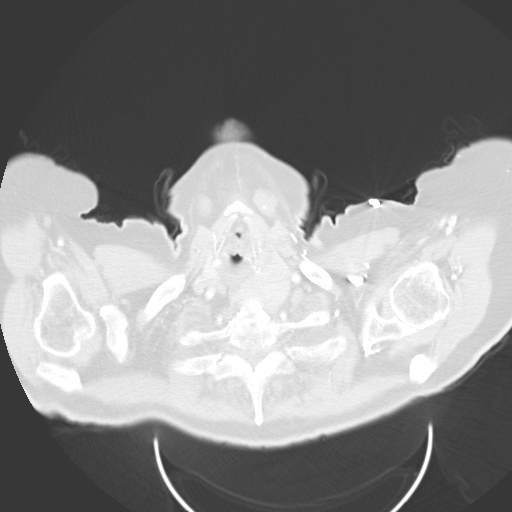

[Series 5: routine chest with cor · coronal · 1.25mm/px · 3 of 161 slices shown]
[im 54/161  soft-tissue]
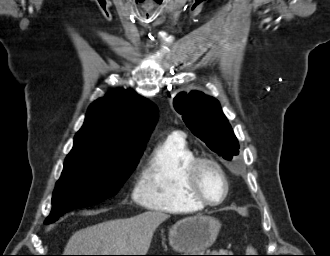
[im 72/161  soft-tissue]
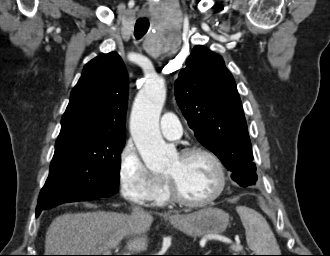
[im 89/161  soft-tissue]
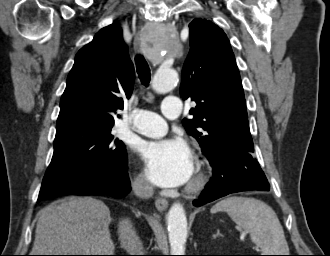

[Series 6: routine chest with sag · sagittal · 0.64mm/px · 1 of 181 slices shown]
[im 61/181  soft-tissue]
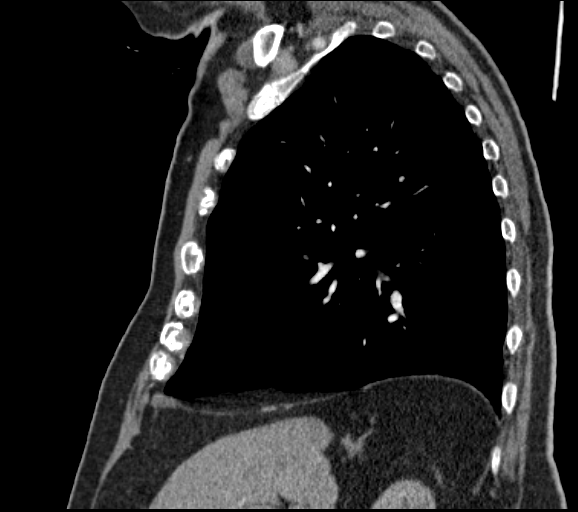

[17 of 46 positions shown; findings below may reference images not displayed]

FINDINGS: There is no axillary lymphadenopathy. Left thyroid lobe is markedly
enlarged and irregular with mixed attenuation and scattered
calcifications throughout. The left lobe measures 7.2 x 6.0 x 7.2 cm
and extends down in the upper mediastinum posterior to both the
trachea and the esophagus. Esophagus and trachea are displaced to
the right.

Scattered small prevascular and central mediastinal lymph nodes are
identified but are not enlarged by CT criteria. There is no hilar
lymphadenopathy. The heart size is normal. No pericardial effusion.

Several areas of abnormal soft tissue are identified in the left
lung and there is subtle volume loss in the left hemi thorax. Mild
circumferential pleural thickening is identified on the left. In the
posterior left lung base, a 4.8 x 3.8 x 4.6 cm soft tissue nodule is
contiguous with the pleura over a distance of 5 cm inferiorly and
3.4 cm posteriorly.

A second pleural-based nodule is seen anteriorly and medially
towards the base and measures 3.8 x 1.5 cm. A third discrete area of
focal soft tissue thickening associated with the pleura is seen in
the anterior left upper hemi thorax, just posterior to the second
rib. This soft tissue lesion measures 4.8 x 2.4 cm.

The patient is noted to have nodular thickening of the left major
fissure.

No evidence for calcified pleural plaques in either hemi thorax.
Venous collateralization in the upper left chest wall suggest an
underlying component of left central venous obstruction.

Lung windows reveal a 9 mm nodule in the anterior right lower lobe
on image 46 of series 3. Right lung is otherwise clear.

Bone windows reveal no worrisome lytic or sclerotic osseous lesions.
Small left adrenal nodule is unchanged since previous abdomen CT of
11/01/2012.
IMPRESSION: Multiple pleural-based masses in the left hemi thorax with a thin
rim of abnormal pleural soft tissue attenuation and subtle volume
loss in the left hemi thorax. There is associated nodular soft
tissue thickening of the left major fissure. Metastatic disease
involving the pleura of the left lung would be a consideration.
Although no evidence for calcified pleural plaques to suggest
previous asbestos exposure, mesothelioma would also be a
consideration.

9 millimeter nodule in the right lower lobe concerning for
metastatic disease.
